# Patient Record
Sex: Male | Born: 1955 | Race: White | Hispanic: No | Marital: Single | Smoking: Former smoker
Health system: Southern US, Community
[De-identification: ages and names within clinical notes are randomized; demographics above are authoritative.]

## PROBLEM LIST (undated history)

## (undated) DIAGNOSIS — I341 Nonrheumatic mitral (valve) prolapse: Secondary | ICD-10-CM

## (undated) DIAGNOSIS — I10 Essential (primary) hypertension: Secondary | ICD-10-CM

## (undated) DIAGNOSIS — I2510 Atherosclerotic heart disease of native coronary artery without angina pectoris: Secondary | ICD-10-CM

## (undated) DIAGNOSIS — Z972 Presence of dental prosthetic device (complete) (partial): Secondary | ICD-10-CM

## (undated) DIAGNOSIS — Z974 Presence of external hearing-aid: Secondary | ICD-10-CM

## (undated) DIAGNOSIS — I7090 Unspecified atherosclerosis: Secondary | ICD-10-CM

## (undated) DIAGNOSIS — I272 Pulmonary hypertension, unspecified: Secondary | ICD-10-CM

## (undated) DIAGNOSIS — Z951 Presence of aortocoronary bypass graft: Secondary | ICD-10-CM

## (undated) DIAGNOSIS — R42 Dizziness and giddiness: Secondary | ICD-10-CM

## (undated) DIAGNOSIS — M1990 Unspecified osteoarthritis, unspecified site: Secondary | ICD-10-CM

## (undated) HISTORY — DX: Unspecified osteoarthritis, unspecified site: M19.90

## (undated) HISTORY — DX: Essential (primary) hypertension: I10

## (undated) HISTORY — PX: COLONOSCOPY: SHX174

## (undated) HISTORY — DX: Dizziness and giddiness: R42

## (undated) HISTORY — DX: Atherosclerotic heart disease of native coronary artery without angina pectoris: I25.10

## (undated) HISTORY — PX: CORONARY ARTERY BYPASS GRAFT: SHX141

## (undated) HISTORY — DX: Unspecified atherosclerosis: I70.90

## (undated) HISTORY — DX: Nonrheumatic mitral (valve) prolapse: I34.1

## (undated) HISTORY — PX: CHOLESTEATOMA EXCISION: SHX1345

---

## 2005-08-24 ENCOUNTER — Ambulatory Visit: Payer: Self-pay | Admitting: Internal Medicine

## 2006-09-03 ENCOUNTER — Other Ambulatory Visit: Payer: Self-pay

## 2006-09-09 ENCOUNTER — Ambulatory Visit: Payer: Self-pay | Admitting: Unknown Physician Specialty

## 2010-10-26 ENCOUNTER — Emergency Department: Payer: Self-pay | Admitting: Emergency Medicine

## 2010-11-01 ENCOUNTER — Ambulatory Visit: Payer: Self-pay | Admitting: Cardiovascular Disease

## 2015-06-09 ENCOUNTER — Other Ambulatory Visit: Payer: Self-pay | Admitting: Unknown Physician Specialty

## 2015-06-09 DIAGNOSIS — H7122 Cholesteatoma of mastoid, left ear: Secondary | ICD-10-CM

## 2015-06-17 ENCOUNTER — Ambulatory Visit
Admission: RE | Admit: 2015-06-17 | Discharge: 2015-06-17 | Disposition: A | Discharge status: Home / Self Care | Payer: 59 | Source: Ambulatory Visit | Attending: Unknown Physician Specialty | Admitting: Unknown Physician Specialty

## 2015-06-17 DIAGNOSIS — H7102 Cholesteatoma of attic, left ear: Secondary | ICD-10-CM | POA: Diagnosis present

## 2015-06-17 DIAGNOSIS — Z9889 Other specified postprocedural states: Secondary | ICD-10-CM | POA: Diagnosis not present

## 2015-06-17 DIAGNOSIS — H7122 Cholesteatoma of mastoid, left ear: Secondary | ICD-10-CM

## 2015-08-23 HISTORY — PX: TYMPANOPLASTY W/ MASTOIDECTOMY: SUR1400

## 2018-04-03 DIAGNOSIS — Z0000 Encounter for general adult medical examination without abnormal findings: Secondary | ICD-10-CM | POA: Diagnosis not present

## 2018-04-14 DIAGNOSIS — E669 Obesity, unspecified: Secondary | ICD-10-CM | POA: Diagnosis not present

## 2018-04-14 DIAGNOSIS — R1310 Dysphagia, unspecified: Secondary | ICD-10-CM | POA: Diagnosis not present

## 2018-04-14 DIAGNOSIS — E8881 Metabolic syndrome: Secondary | ICD-10-CM | POA: Diagnosis not present

## 2018-04-14 DIAGNOSIS — Z0000 Encounter for general adult medical examination without abnormal findings: Secondary | ICD-10-CM | POA: Diagnosis not present

## 2018-04-15 ENCOUNTER — Telehealth: Payer: Self-pay | Admitting: Gastroenterology

## 2018-04-15 NOTE — Telephone Encounter (Signed)
Patient called back to schedule colonoscopy.  ?

## 2018-04-16 DIAGNOSIS — F5221 Male erectile disorder: Secondary | ICD-10-CM | POA: Diagnosis not present

## 2018-04-16 NOTE — Telephone Encounter (Signed)
Patient having stomach pains, trouble swallowing, and constipation for the past 6 months. Will come in to be seen.

## 2018-04-17 ENCOUNTER — Encounter: Payer: Self-pay | Admitting: Gastroenterology

## 2018-04-17 ENCOUNTER — Ambulatory Visit (INDEPENDENT_AMBULATORY_CARE_PROVIDER_SITE_OTHER): Payer: Medicare Other | Admitting: Gastroenterology

## 2018-04-17 VITALS — BP 138/84 | HR 55 | Temp 97.8°F | Ht 65.0 in | Wt 241.4 lb

## 2018-04-17 DIAGNOSIS — K5900 Constipation, unspecified: Secondary | ICD-10-CM | POA: Diagnosis not present

## 2018-04-17 DIAGNOSIS — K219 Gastro-esophageal reflux disease without esophagitis: Secondary | ICD-10-CM | POA: Diagnosis not present

## 2018-04-17 NOTE — Progress Notes (Signed)
Vonda Antigua [address]  [place]  [place], [place] [address]  Main: [phone]  Fax: [phone]   Gastroenterology Consultation  Referring Provider:     Cletis Athens, MD Primary Care Physician:  Cletis Athens, MD Primary Gastroenterologist:  Dr. Vonda Antigua Reason for Consultation:     Constipation, abdominal pain, acid reflux        HPI:    Chief Complaint  Patient presents with  . Establish Care    referral from Dr. Michele Rockers pain, constipation    MAXSON ODDO is a 62 y.o. y/o male referred for consultation & management  by Dr. Cletis Athens, MD.  Patient reports chronic history of constipation.  Does have a bowel movement every 1 to 2 days.  Sometimes it soft, sometimes he has to strain.  No blood in stool.  States he is on an over-the-counter stool softener, that he does not know the name of.  Does not eat a high-fiber diet.  Reports he had a colonoscopy that he thinks might of been about 5 years ago at [facility], does not think any polyps were removed.  We do not have the report.  To the only report we have was a 2006 colonoscopy, seen under provation which reports a normal colon and internal hemorrhoids.  Patient reports family history of colon cancer in her mother, who was diagnosed in her 74s.  Also reports intermittent episodes of acid reflux.  States he recently had recurrence of acid reflux over the last 2 to 3 months.  Has not had acid reflux in over 15 years.  No dysphagia.  Reports intermittent midepigastric pain as well, sharp, no radiation, lasts 30 seconds, occurs 1-2 times a day, occurs after meals, self resolves.  No previous EGDs.  No NSAID use besides aspirin.  Past medical history: Hypertension  Past Surgical History:  Procedure Laterality Date  . COLONOSCOPY     3-4 yrs. ago  Hx polyps    Prior to Admission medications   Medication Sig Start Date End Date Taking? Authorizing Provider  aspirin EC 81 MG tablet Take by mouth.    Yes [provider]  sodium chloride (OCEAN) 0.65 % SOLN nasal spray Place 1 spray into both nostrils as needed for congestion.   Yes [provider]    Family History  Problem Relation Age of Onset  . Cancer Mother        colon  . Cancer Sister        lung cancer-hx smoking  . Heart failure Sister      Social History   Tobacco Use  . Smoking status: Former Research scientist (life sciences)  . Smokeless tobacco: Never Used  Substance Use Topics  . Alcohol use: Yes    Comment: occ.  . Drug use: Never    Allergies as of 04/17/2018  . (No Known Allergies)    Review of Systems:    All systems reviewed and negative except where noted in HPI.   Physical Exam:  BP 138/84   Pulse (!) 55   Temp 97.8 F (36.6 C) (Oral)   Ht 5\' 5"  (1.651 m)   Wt 241 lb 6.4 oz (109.5 kg)   BMI 40.17 kg/m  No LMP for male patient. Psych:  Alert and cooperative. Normal mood and affect. General:   Alert,  Well-developed, well-nourished, pleasant and cooperative in NAD Head:  Normocephalic and atraumatic. Eyes:  Sclera clear, no icterus.   Conjunctiva pink. Ears:  Normal auditory acuity. Nose:  No deformity, discharge, or lesions. Mouth:  No deformity or lesions,oropharynx pink & moist. Neck:  Supple; no masses or thyromegaly. Lungs:  Respirations even and unlabored.  Clear throughout to auscultation.   No wheezes, crackles, or rhonchi. No acute distress. Heart:  Regular rate and rhythm; no murmurs, clicks, rubs, or gallops. Abdomen:  Normal bowel sounds.  No bruits.  Soft, non-tender and non-distended without masses, hepatosplenomegaly or hernias noted.  No guarding or rebound tenderness.    Msk:  Symmetrical without gross deformities. Good, equal movement & strength bilaterally. Pulses:  Normal pulses noted. Extremities:  No clubbing or edema.  No cyanosis. Neurologic:  Alert and oriented x3;  grossly normal neurologically. Skin:  Intact without significant lesions or rashes. No jaundice. Lymph  Nodes:  No significant cervical adenopathy. Psych:  Alert and cooperative. Normal mood and affect.   Labs: CBC No results found for: WBC, RBC, HGB, HCT, PLT, MCV, MCH, MCHC, RDW, LYMPHSABS, MONOABS, EOSABS, BASOSABS CMP  No results found for: NA, K, CL, CO2, GLUCOSE, BUN, CREATININE, CALCIUM, PROT, ALBUMIN, AST, ALT, ALKPHOS, BILITOT, GFRNONAA, GFRAA  Imaging Studies: No results found.  Assessment and Plan:   ZANDON TALTON is a 62 y.o. y/o male has been referred for history of constipation, and acid reflux  Constipation due to not eating enough fiber High-fiber diet encouraged Patient asked to start using MiraLAX or Metamucil daily to maintain soft stool We will schedule for high risk screening colonoscopy, as according to patient last one was over 5 years ago (report of which not available)  Recurrence of acid reflux Patient educated extensively on acid reflux lifestyle modification, including buying a bed wedge, not eating 3 hrs before bedtime, diet modifications, and handout given for the same.  Patient with over 44 years old, has recurrence of symptoms, will evaluate with EGD to rule out any underlying Barrett's, obtain H. pylori biopsies and rule out any underlying ulcers  I have discussed alternative options, risks & benefits,  which include, but are not limited to, bleeding, infection, perforation,respiratory complication & drug reaction.  The patient agrees with this plan & written consent will be obtained.     Dr Vonda Antigua

## 2018-04-18 ENCOUNTER — Telehealth: Payer: Self-pay | Admitting: Gastroenterology

## 2018-04-18 NOTE — Telephone Encounter (Signed)
Patient LVM that he wants to give you a list of his medications.

## 2018-04-22 ENCOUNTER — Telehealth: Payer: Self-pay | Admitting: Gastroenterology

## 2018-04-22 DIAGNOSIS — K219 Gastro-esophageal reflux disease without esophagitis: Secondary | ICD-10-CM

## 2018-04-22 DIAGNOSIS — R109 Unspecified abdominal pain: Secondary | ICD-10-CM

## 2018-04-22 DIAGNOSIS — Z1211 Encounter for screening for malignant neoplasm of colon: Secondary | ICD-10-CM

## 2018-04-22 DIAGNOSIS — Z800 Family history of malignant neoplasm of digestive organs: Secondary | ICD-10-CM

## 2018-04-22 NOTE — Telephone Encounter (Signed)
Medication List: Atorvastatin 400 mg once a day Metoprol Tar 25 mg q 12 h Loratadine 10 mg 1 tab a day Asprin 81 mg

## 2018-04-22 NOTE — Telephone Encounter (Signed)
Refer to other message dated 04/22/18

## 2018-04-22 NOTE — Telephone Encounter (Signed)
Left message that medication list received and can now schedule colonoscopy and EGD. Pt is not on Plavix as previously seen in chart and had to call with the other medication he was on.

## 2018-04-22 NOTE — Telephone Encounter (Signed)
Pt called and will contact office in about 15 minutes, he is at work.

## 2018-04-23 ENCOUNTER — Telehealth: Payer: Self-pay | Admitting: Gastroenterology

## 2018-04-23 ENCOUNTER — Other Ambulatory Visit: Payer: Self-pay

## 2018-04-23 ENCOUNTER — Encounter: Payer: Self-pay | Admitting: Gastroenterology

## 2018-04-23 DIAGNOSIS — Z1211 Encounter for screening for malignant neoplasm of colon: Secondary | ICD-10-CM

## 2018-04-23 DIAGNOSIS — R109 Unspecified abdominal pain: Secondary | ICD-10-CM

## 2018-04-23 DIAGNOSIS — Z800 Family history of malignant neoplasm of digestive organs: Secondary | ICD-10-CM

## 2018-04-23 DIAGNOSIS — K219 Gastro-esophageal reflux disease without esophagitis: Secondary | ICD-10-CM

## 2018-04-23 MED ORDER — BISACODYL 5 MG PO TBEC: 10.0000 mg | DELAYED_RELEASE_TABLET | Freq: Once | ORAL | 0 refills | Status: AC

## 2018-04-23 MED ORDER — BISACODYL 5 MG PO TBEC: 10.0000 mg | DELAYED_RELEASE_TABLET | Freq: Once | ORAL | 0 refills | Status: DC

## 2018-04-23 MED ORDER — PEG 3350-KCL-NABCB-NACL-NASULF 236 G PO SOLR: 4000.0000 mL | Freq: Once | ORAL | 0 refills | Status: AC

## 2018-04-23 MED ORDER — PEG 3350-KCL-NA BICARB-NACL 420 G PO SOLR: 4000.0000 mL | Freq: Once | ORAL | 0 refills | Status: AC

## 2018-04-23 NOTE — Telephone Encounter (Signed)
Wife notified that rx's sent to [facility] in [place].

## 2018-04-23 NOTE — Telephone Encounter (Signed)
Patient came in and wants his prep called into [facility] in [place]

## 2018-04-23 NOTE — Telephone Encounter (Signed)
I spoke with pt's wife and scheduled pt's colonoscopy and EGD for 05/01/18 at [facility]. Went over prep instructions with his wife and she will pick those up this afternoon. To contact office if they have any further questions.

## 2018-04-30 ENCOUNTER — Encounter: Payer: Self-pay | Admitting: Student

## 2018-05-01 ENCOUNTER — Ambulatory Visit: Payer: Medicare Other | Admitting: Anesthesiology

## 2018-05-01 ENCOUNTER — Encounter
Admission: RE | Disposition: A | Discharge status: Home / Self Care | Payer: Self-pay | Source: Ambulatory Visit | Attending: Gastroenterology

## 2018-05-01 ENCOUNTER — Ambulatory Visit
Admission: RE | Admit: 2018-05-01 | Discharge: 2018-05-01 | Disposition: A | Discharge status: Home / Self Care | Payer: Medicare Other | Source: Ambulatory Visit | Attending: Gastroenterology | Admitting: Gastroenterology

## 2018-05-01 ENCOUNTER — Other Ambulatory Visit: Payer: Self-pay

## 2018-05-01 ENCOUNTER — Encounter: Payer: Self-pay | Admitting: *Deleted

## 2018-05-01 DIAGNOSIS — I341 Nonrheumatic mitral (valve) prolapse: Secondary | ICD-10-CM | POA: Insufficient documentation

## 2018-05-01 DIAGNOSIS — K2960 Other gastritis without bleeding: Secondary | ICD-10-CM | POA: Diagnosis not present

## 2018-05-01 DIAGNOSIS — R1084 Generalized abdominal pain: Secondary | ICD-10-CM | POA: Diagnosis not present

## 2018-05-01 DIAGNOSIS — Z79899 Other long term (current) drug therapy: Secondary | ICD-10-CM | POA: Insufficient documentation

## 2018-05-01 DIAGNOSIS — M1990 Unspecified osteoarthritis, unspecified site: Secondary | ICD-10-CM | POA: Insufficient documentation

## 2018-05-01 DIAGNOSIS — D122 Benign neoplasm of ascending colon: Secondary | ICD-10-CM | POA: Diagnosis not present

## 2018-05-01 DIAGNOSIS — Z951 Presence of aortocoronary bypass graft: Secondary | ICD-10-CM | POA: Insufficient documentation

## 2018-05-01 DIAGNOSIS — R109 Unspecified abdominal pain: Secondary | ICD-10-CM

## 2018-05-01 DIAGNOSIS — Z7982 Long term (current) use of aspirin: Secondary | ICD-10-CM | POA: Insufficient documentation

## 2018-05-01 DIAGNOSIS — I10 Essential (primary) hypertension: Secondary | ICD-10-CM | POA: Insufficient documentation

## 2018-05-01 DIAGNOSIS — K219 Gastro-esophageal reflux disease without esophagitis: Secondary | ICD-10-CM | POA: Diagnosis not present

## 2018-05-01 DIAGNOSIS — Z8249 Family history of ischemic heart disease and other diseases of the circulatory system: Secondary | ICD-10-CM | POA: Insufficient documentation

## 2018-05-01 DIAGNOSIS — Z6839 Body mass index (BMI) 39.0-39.9, adult: Secondary | ICD-10-CM | POA: Insufficient documentation

## 2018-05-01 DIAGNOSIS — Z800 Family history of malignant neoplasm of digestive organs: Secondary | ICD-10-CM | POA: Insufficient documentation

## 2018-05-01 DIAGNOSIS — Z1211 Encounter for screening for malignant neoplasm of colon: Secondary | ICD-10-CM | POA: Insufficient documentation

## 2018-05-01 DIAGNOSIS — K3189 Other diseases of stomach and duodenum: Secondary | ICD-10-CM

## 2018-05-01 DIAGNOSIS — I2510 Atherosclerotic heart disease of native coronary artery without angina pectoris: Secondary | ICD-10-CM | POA: Insufficient documentation

## 2018-05-01 DIAGNOSIS — Z801 Family history of malignant neoplasm of trachea, bronchus and lung: Secondary | ICD-10-CM | POA: Diagnosis not present

## 2018-05-01 DIAGNOSIS — R12 Heartburn: Secondary | ICD-10-CM | POA: Diagnosis not present

## 2018-05-01 DIAGNOSIS — K2100 Gastro-esophageal reflux disease with esophagitis, without bleeding: Secondary | ICD-10-CM

## 2018-05-01 DIAGNOSIS — K635 Polyp of colon: Secondary | ICD-10-CM | POA: Diagnosis not present

## 2018-05-01 DIAGNOSIS — D120 Benign neoplasm of cecum: Secondary | ICD-10-CM | POA: Insufficient documentation

## 2018-05-01 DIAGNOSIS — D126 Benign neoplasm of colon, unspecified: Secondary | ICD-10-CM | POA: Diagnosis not present

## 2018-05-01 HISTORY — PX: ESOPHAGOGASTRODUODENOSCOPY (EGD) WITH PROPOFOL: SHX5813

## 2018-05-01 HISTORY — PX: COLONOSCOPY WITH PROPOFOL: SHX5780

## 2018-05-01 SURGERY — COLONOSCOPY WITH PROPOFOL
Anesthesia: General

## 2018-05-01 MED ORDER — PROPOFOL 500 MG/50ML IV EMUL
INTRAVENOUS | Status: AC
  Filled 2018-05-01: qty 50

## 2018-05-01 MED ORDER — LIDOCAINE HCL (CARDIAC) PF 100 MG/5ML IV SOSY
PREFILLED_SYRINGE | INTRAVENOUS | Status: DC | PRN
  Administered 2018-05-01: 100 mg via INTRAVENOUS

## 2018-05-01 MED ORDER — PROPOFOL 10 MG/ML IV BOLUS
INTRAVENOUS | Status: AC
  Filled 2018-05-01: qty 20

## 2018-05-01 MED ORDER — PROPOFOL 10 MG/ML IV BOLUS
INTRAVENOUS | Status: DC | PRN
  Administered 2018-05-01: 70 mg via INTRAVENOUS

## 2018-05-01 MED ORDER — RANITIDINE HCL 75 MG PO TABS: 75.0000 mg | ORAL_TABLET | Freq: Two times a day (BID) | ORAL | 1 refills | Status: DC

## 2018-05-01 MED ORDER — LIDOCAINE HCL (PF) 2 % IJ SOLN
INTRAMUSCULAR | Status: AC
  Filled 2018-05-01: qty 10

## 2018-05-01 MED ORDER — PHENYLEPHRINE HCL 10 MG/ML IJ SOLN
INTRAMUSCULAR | Status: DC | PRN
  Administered 2018-05-01 (×3): 50 ug via INTRAVENOUS

## 2018-05-01 MED ORDER — PROPOFOL 500 MG/50ML IV EMUL
INTRAVENOUS | Status: DC | PRN
  Administered 2018-05-01: 160 ug/kg/min via INTRAVENOUS

## 2018-05-01 MED ORDER — SODIUM CHLORIDE 0.9 % IV SOLN
INTRAVENOUS | Status: DC
  Administered 2018-05-01: 08:00:00 via INTRAVENOUS

## 2018-05-01 NOTE — Transfer of Care (Signed)
Immediate Anesthesia Transfer of Care Note  Patient: Thomas Hood  Procedure(s) Performed: COLONOSCOPY WITH PROPOFOL (N/A ) ESOPHAGOGASTRODUODENOSCOPY (EGD) WITH PROPOFOL (N/A )  Patient Location: PACU  Anesthesia Type:General  Level of Consciousness: awake, alert  and oriented  Airway & Oxygen Therapy: Patient Spontanous Breathing and Patient connected to nasal cannula oxygen  Post-op Assessment: Report given to RN and Post -op Vital signs reviewed and stable  Post vital signs: Reviewed and stable  Last Vitals:  Vitals Value Taken Time  BP 99/61 0916  Temp 97   Pulse 50   Resp 13   SpO2 100     Last Pain:  Vitals:   05/01/18 0719  TempSrc: Tympanic  PainSc: 0-No pain         Complications: No apparent anesthesia complications

## 2018-05-01 NOTE — Anesthesia Postprocedure Evaluation (Signed)
Anesthesia Post Note  Patient: Thomas Hood  Procedure(s) Performed: COLONOSCOPY WITH PROPOFOL (N/A ) ESOPHAGOGASTRODUODENOSCOPY (EGD) WITH PROPOFOL (N/A )  Patient location during evaluation: Endoscopy Anesthesia Type: General Level of consciousness: awake and alert Pain management: pain level controlled Vital Signs Assessment: post-procedure vital signs reviewed and stable Respiratory status: spontaneous breathing, nonlabored ventilation and respiratory function stable Cardiovascular status: blood pressure returned to baseline and stable Postop Assessment: no apparent nausea or vomiting Anesthetic complications: no     Last Vitals:  Vitals:   05/01/18 0937 05/01/18 0947  BP: (!) 89/68 114/82  Pulse:    Resp:    Temp:    SpO2: 98%     Last Pain:  Vitals:   05/01/18 0947  TempSrc:   PainSc: 0-No pain                 Alphonsus Sias

## 2018-05-01 NOTE — H&P (Signed)
Vonda Antigua, MD [address], [place], [place], [place], [address] [address], [place], [place], [place], [address] Phone: [phone]  Fax: [phone]  Primary Care Physician:  Cletis Athens, MD   Pre-Procedure History & Physical: HPI:  ABEM SHADDIX is a 62 y.o. male is here for a colonoscopy and EGD.   Past Medical History:  Diagnosis Date  . Arthritis   . Atherosclerosis   . CAD (coronary artery disease)   . Dizziness   . HTN (hypertension)   . Mitral valve prolapse     Past Surgical History:  Procedure Laterality Date  . CHOLESTEATOMA EXCISION Right   . COLONOSCOPY     3-4 yrs. ago  Hx polyps  . CORONARY ARTERY BYPASS GRAFT    . TYMPANOPLASTY W/ MASTOIDECTOMY  08/23/2015    Prior to Admission medications   Medication Sig Start Date End Date Taking? Authorizing Provider  atorvastatin (LIPITOR) 40 MG tablet Take 40 mg by mouth daily.   Yes [provider]  loratadine (CLARITIN) 10 MG tablet  03/03/18  Yes [provider]  metoprolol tartrate (LOPRESSOR) 25 MG tablet Take 1 tablet by mouth every 12 (twelve) hours. 04/16/18  Yes [provider]  Multiple Vitamin (MULTIVITAMIN) tablet Take 1 tablet by mouth daily.   Yes [provider]  aspirin EC 81 MG tablet Take by mouth.    [provider]  sodium chloride (OCEAN) 0.65 % SOLN nasal spray Place 1 spray into both nostrils as needed for congestion.    [provider]    Allergies as of 04/23/2018  . (No Known Allergies)    Family History  Problem Relation Age of Onset  . Cancer Mother        colon  . Cancer Sister        lung cancer-hx smoking  . Heart failure Sister     Social History   Socioeconomic History  . Marital status: Single    Spouse name: Not on file  . Number of children: Not on file  . Years of education: Not on file  . Highest education level: Not on file  Occupational History  . Not on file  Social Needs  . Financial  resource strain: Not on file  . Food insecurity:    Worry: Not on file    Inability: Not on file  . Transportation needs:    Medical: Not on file    Non-medical: Not on file  Tobacco Use  . Smoking status: Former Research scientist (life sciences)  . Smokeless tobacco: Never Used  Substance and Sexual Activity  . Alcohol use: Yes    Comment: occ.  . Drug use: Never  . Sexual activity: Not on file  Lifestyle  . Physical activity:    Days per week: Not on file    Minutes per session: Not on file  . Stress: Not on file  Relationships  . Social connections:    Talks on phone: Not on file    Gets together: Not on file    Attends religious service: Not on file    Active member of club or organization: Not on file    Attends meetings of clubs or organizations: Not on file    Relationship status: Not on file  . Intimate partner violence:    Fear of current or ex partner: Not on file    Emotionally abused: Not on file    Physically abused: Not on file    Forced sexual activity: Not on  file  Other Topics Concern  . Not on file  Social History Narrative  . Not on file    Review of Systems: See HPI, otherwise negative ROS  Physical Exam: BP 120/71   Pulse (!) 56   Temp (!) 97 F (36.1 C) (Tympanic)   Resp 18   Ht 5\' 5"  (1.651 m)   Wt 240 lb (108.9 kg)   SpO2 100%   BMI 39.94 kg/m  General:   Alert,  pleasant and cooperative in NAD Head:  Normocephalic and atraumatic. Neck:  Supple; no masses or thyromegaly. Lungs:  Clear throughout to auscultation, normal respiratory effort.    Heart:  +S1, +S2, Regular rate and rhythm, No edema. Abdomen:  Soft, nontender and nondistended. Normal bowel sounds, without guarding, and without rebound.   Neurologic:  Alert and  oriented x4;  grossly normal neurologically.  Impression/Plan: CAGNEY DEGRACE is here for a colonoscopy to be performed for average risk screening and EGD for Acid Reflux.  Risks, benefits, limitations, and alternatives regarding the  procedures have been reviewed with the patient.  Questions have been answered.  All parties agreeable.   Virgel Manifold, MD  05/01/2018, 8:02 AM

## 2018-05-01 NOTE — Anesthesia Post-op Follow-up Note (Signed)
Anesthesia QCDR form completed.        

## 2018-05-01 NOTE — Op Note (Signed)
[facility] Gastroenterology Patient Name: Thomas Hood Procedure Date: 05/01/2018 7:20 AM MRN: [id number] Account #: [id number] Date of Birth: 1956/05/22 Admit Type: Outpatient Age: 62 Room: [facility] ENDO ROOM 4 Gender: Male Note Status: Finalized Procedure:            Colonoscopy Indications:          Screening in patient at increased risk: Family history                        of 1st-degree relative with colorectal cancer before                        age 72 years Providers:            Merri Dimaano B. Bonna Gains MD, MD Referring MD:         Cletis Athens, MD (Referring MD) Medicines:            Monitored Anesthesia Care Complications:        No immediate complications. Procedure:            Pre-Anesthesia Assessment:                       - ASA Grade Assessment: III - A patient with severe                        systemic disease.                       - Prior to the procedure, a History and Physical was                        performed, and patient medications, allergies and                        sensitivities were reviewed. The patient's tolerance of                        previous anesthesia was reviewed.                       - The risks and benefits of the procedure and the                        sedation options and risks were discussed with the                        patient. All questions were answered and informed                        consent was obtained.                       - Patient identification and proposed procedure were                        verified prior to the procedure by the physician, the                        nurse, the anesthesiologist, the anesthetist and the  technician. The procedure was verified in the procedure                        room.                       After obtaining informed consent, the colonoscope was                        passed under direct vision. Throughout the procedure,   the patient's blood pressure, pulse, and oxygen                        saturations were monitored continuously. The                        Colonoscope was introduced through the anus and                        advanced to the the cecum, identified by appendiceal                        orifice and ileocecal valve. The colonoscopy was                        performed with ease. The patient tolerated the                        procedure well. The quality of the bowel preparation                        was good. Findings:      The perianal and digital rectal examinations were normal.      Six sessile polyps were found in the ascending colon and cecum. The       polyps were 4 to 7 mm in size. These polyps were removed with a cold       snare. Resection and retrieval were complete.      The exam was otherwise without abnormality.      The rectum, sigmoid colon, descending colon, transverse colon, ascending       colon and cecum appeared normal.      The retroflexed view of the distal rectum and anal verge was normal and       showed no anal or rectal abnormalities. Impression:           - Six 4 to 7 mm polyps in the ascending colon and in                        the cecum, removed with a cold snare. Resected and                        retrieved.                       - The examination was otherwise normal.                       - The rectum, sigmoid colon, descending colon,                        transverse colon, ascending colon and  cecum are normal.                       - The distal rectum and anal verge are normal on                        retroflexion view. Recommendation:       - Discharge patient to home (with escort).                       - Advance diet as tolerated.                       - Continue present medications.                       - Await pathology results.                       - Repeat colonoscopy in 3 years for surveillance.                       - The findings and  recommendations were discussed with                        the patient.                       - The findings and recommendations were discussed with                        the patient's family.                       - Return to primary care physician as previously                        scheduled. Procedure Code(s):    --- Professional ---                       [phone], Colonoscopy, flexible; with removal of tumor(s),                        polyp(s), or other lesion(s) by snare technique Diagnosis Code(s):    --- Professional ---                       Z80.0, Family history of malignant neoplasm of                        digestive organs                       D12.2, Benign neoplasm of ascending colon                       D12.0, Benign neoplasm of cecum CPT copyright 2017 American Medical Association. All rights reserved. The codes documented in this report are preliminary and upon coder review may  be revised to meet current compliance requirements.  Vonda Antigua, MD Margretta Sidle B. Bonna Gains MD, MD 05/01/2018 9:15:59 AM This report has been signed electronically. Number of Addenda: 0 Note Initiated On: 05/01/2018 7:20 AM Scope Withdrawal Time: 0 hours 31 minutes 58 seconds  Total Procedure Duration: 0 hours 36 minutes 56 seconds  Estimated Blood Loss: Estimated blood loss: none.      [facility]

## 2018-05-01 NOTE — Op Note (Addendum)
[facility] Gastroenterology Patient Name: Thomas Hood Procedure Date: 05/01/2018 7:23 AM MRN: [id number] Account #: [id number] Date of Birth: 08/30/1956 Admit Type: Outpatient Age: 62 Room: [facility] ENDO ROOM 4 Gender: Male Note Status: Finalized Procedure:            Upper GI endoscopy Indications:          Heartburn Providers:            Nikeya Maxim B. Bonna Gains MD, MD Referring MD:         Cletis Athens, MD (Referring MD) Medicines:            Monitored Anesthesia Care Complications:        No immediate complications. Procedure:            Pre-Anesthesia Assessment:                       - Prior to the procedure, a History and Physical was                        performed, and patient medications, allergies and                        sensitivities were reviewed. The patient's tolerance of                        previous anesthesia was reviewed.                       - The risks and benefits of the procedure and the                        sedation options and risks were discussed with the                        patient. All questions were answered and informed                        consent was obtained.                       - Patient identification and proposed procedure were                        verified prior to the procedure by the physician, the                        nurse, the anesthesiologist, the anesthetist and the                        technician. The procedure was verified in the procedure                        room.                       - ASA Grade Assessment: III - A patient with severe                        systemic disease.                       After obtaining  informed consent, the endoscope was                        passed under direct vision. Throughout the procedure,                        the patient's blood pressure, pulse, and oxygen                        saturations were monitored continuously. The Endoscope                        was  introduced through the mouth, and advanced to the                        third part of duodenum. The upper GI endoscopy was                        accomplished with ease. The patient tolerated the                        procedure well. Findings:      LA Grade A (one or more mucosal breaks less than 5 mm, not extending       between tops of 2 mucosal folds) esophagitis with no bleeding was found       in the distal esophagus.      Patchy mildly erythematous mucosa without bleeding was found in the       gastric antrum. Biopsies were taken with a cold forceps for histology.       Biopsies were obtained in the gastric body, at the incisura and in the       gastric antrum with cold forceps for histology.      The duodenal bulb, second portion of the duodenum and examined duodenum       were normal. Impression:           - LA Grade A reflux esophagitis.                       - Erythematous mucosa in the antrum. Biopsied.                       - Normal duodenal bulb, second portion of the duodenum                        and examined duodenum.                       - Biopsies were obtained in the gastric body, at the                        incisura and in the gastric antrum. Recommendation:       - Await pathology results.                       - Follow an antireflux regimen.                       - Take prescribed proton pump inhibitor or H2 blocker                        (  antacid) medications 30 - 60 minutes before meals.                       - Discharge patient to home (with escort).                       - Advance diet as tolerated.                       - Continue present medications.                       - Patient has a contact number available for                        emergencies. The signs and symptoms of potential                        delayed complications were discussed with the patient.                        Return to normal activities tomorrow. Written discharge                         instructions were provided to the patient.                       - Discharge patient to home (with escort).                       - The findings and recommendations were discussed with                        the patient.                       - The findings and recommendations were discussed with                        the patient's family.                       - Return to my office as previously scheduled. Procedure Code(s):    --- Professional ---                       [phone], Esophagogastroduodenoscopy, flexible, transoral;                        with biopsy, single or multiple Diagnosis Code(s):    --- Professional ---                       K21.0, Gastro-esophageal reflux disease with esophagitis                       K31.89, Other diseases of stomach and duodenum                       R12, Heartburn CPT copyright 2017 American Medical Association. All rights reserved. The codes documented in this report are preliminary and upon coder review may  be revised to meet current compliance requirements.  Vonda Antigua, MD Margretta Sidle B. Bonna Gains MD, MD  05/01/2018 8:16:39 AM This report has been signed electronically. Number of Addenda: 0 Note Initiated On: 05/01/2018 7:23 AM Estimated Blood Loss: Estimated blood loss: none.      [facility]

## 2018-05-01 NOTE — Anesthesia Procedure Notes (Signed)
Performed by: Oddis Westling, CRNA Pre-anesthesia Checklist: Patient identified, Emergency Drugs available, Suction available and Patient being monitored Patient Re-evaluated:Patient Re-evaluated prior to induction Oxygen Delivery Method: Nasal cannula Induction Type: IV induction       

## 2018-05-01 NOTE — Anesthesia Preprocedure Evaluation (Signed)
Anesthesia Evaluation  Patient identified by MRN, date of birth, ID band Patient awake    Reviewed: Allergy & Precautions, H&P , NPO status , reviewed documented beta blocker date and time   Airway Mallampati: II  TM Distance: >3 FB Neck ROM: full    Dental  (+) Upper Dentures, Lower Dentures   Pulmonary former smoker,    Pulmonary exam normal        Cardiovascular hypertension, + CAD  Normal cardiovascular exam     Neuro/Psych    GI/Hepatic   Endo/Other  Morbid obesity  Renal/GU      Musculoskeletal  (+) Arthritis ,   Abdominal   Peds  Hematology   Anesthesia Other Findings Past Medical History: No date: Arthritis No date: Atherosclerosis No date: CAD (coronary artery disease) No date: Dizziness No date: HTN (hypertension) No date: Mitral valve prolapse  Past Surgical History: No date: CHOLESTEATOMA EXCISION; Right No date: COLONOSCOPY     Comment:  3-4 yrs. ago  Hx polyps No date: CORONARY ARTERY BYPASS GRAFT 08/23/2015: TYMPANOPLASTY W/ MASTOIDECTOMY  BMI    Body Mass Index:  39.94 kg/m      Reproductive/Obstetrics                             Anesthesia Physical Anesthesia Plan  ASA: III  Anesthesia Plan: General   Post-op Pain Management:    Induction:   PONV Risk Score and Plan: 2 and Treatment may vary due to age or medical condition and TIVA  Airway Management Planned:   Additional Equipment:   Intra-op Plan:   Post-operative Plan:   Informed Consent: I have reviewed the patients History and Physical, chart, labs and discussed the procedure including the risks, benefits and alternatives for the proposed anesthesia with the patient or authorized representative who has indicated his/her understanding and acceptance.   Dental Advisory Given  Plan Discussed with: CRNA  Anesthesia Plan Comments:         Anesthesia Quick Evaluation

## 2018-05-02 ENCOUNTER — Encounter: Payer: Self-pay | Admitting: Gastroenterology

## 2018-05-02 LAB — SURGICAL PATHOLOGY

## 2019-11-03 DIAGNOSIS — Z6841 Body Mass Index (BMI) 40.0 and over, adult: Secondary | ICD-10-CM | POA: Diagnosis not present

## 2019-11-03 DIAGNOSIS — Z20828 Contact with and (suspected) exposure to other viral communicable diseases: Secondary | ICD-10-CM | POA: Diagnosis not present

## 2019-11-18 ENCOUNTER — Encounter (HOSPITAL_COMMUNITY): Payer: Self-pay | Admitting: Emergency Medicine

## 2019-11-18 ENCOUNTER — Inpatient Hospital Stay (HOSPITAL_COMMUNITY)
Admission: EM | Admit: 2019-11-18 | Discharge: 2019-11-24 | DRG: 177 | Disposition: A | Discharge status: Home / Self Care | Payer: Medicare Other | Attending: Internal Medicine | Admitting: Internal Medicine

## 2019-11-18 ENCOUNTER — Emergency Department (HOSPITAL_COMMUNITY): Payer: Medicare Other

## 2019-11-18 ENCOUNTER — Other Ambulatory Visit: Payer: Self-pay

## 2019-11-18 DIAGNOSIS — M1990 Unspecified osteoarthritis, unspecified site: Secondary | ICD-10-CM | POA: Diagnosis present

## 2019-11-18 DIAGNOSIS — R0602 Shortness of breath: Secondary | ICD-10-CM

## 2019-11-18 DIAGNOSIS — J9601 Acute respiratory failure with hypoxia: Secondary | ICD-10-CM | POA: Diagnosis not present

## 2019-11-18 DIAGNOSIS — E669 Obesity, unspecified: Secondary | ICD-10-CM | POA: Diagnosis present

## 2019-11-18 DIAGNOSIS — J1289 Other viral pneumonia: Secondary | ICD-10-CM | POA: Diagnosis not present

## 2019-11-18 DIAGNOSIS — Z8249 Family history of ischemic heart disease and other diseases of the circulatory system: Secondary | ICD-10-CM

## 2019-11-18 DIAGNOSIS — I10 Essential (primary) hypertension: Secondary | ICD-10-CM | POA: Diagnosis not present

## 2019-11-18 DIAGNOSIS — U071 COVID-19: Secondary | ICD-10-CM | POA: Diagnosis not present

## 2019-11-18 DIAGNOSIS — J069 Acute upper respiratory infection, unspecified: Secondary | ICD-10-CM | POA: Diagnosis present

## 2019-11-18 DIAGNOSIS — Z87891 Personal history of nicotine dependence: Secondary | ICD-10-CM

## 2019-11-18 DIAGNOSIS — Z951 Presence of aortocoronary bypass graft: Secondary | ICD-10-CM

## 2019-11-18 DIAGNOSIS — I2510 Atherosclerotic heart disease of native coronary artery without angina pectoris: Secondary | ICD-10-CM | POA: Diagnosis not present

## 2019-11-18 DIAGNOSIS — R0902 Hypoxemia: Secondary | ICD-10-CM

## 2019-11-18 DIAGNOSIS — E785 Hyperlipidemia, unspecified: Secondary | ICD-10-CM | POA: Diagnosis present

## 2019-11-18 DIAGNOSIS — Z6839 Body mass index (BMI) 39.0-39.9, adult: Secondary | ICD-10-CM

## 2019-11-18 DIAGNOSIS — I341 Nonrheumatic mitral (valve) prolapse: Secondary | ICD-10-CM | POA: Diagnosis present

## 2019-11-18 DIAGNOSIS — J129 Viral pneumonia, unspecified: Secondary | ICD-10-CM | POA: Diagnosis present

## 2019-11-18 DIAGNOSIS — I700 Atherosclerosis of aorta: Secondary | ICD-10-CM | POA: Diagnosis present

## 2019-11-18 DIAGNOSIS — Z7982 Long term (current) use of aspirin: Secondary | ICD-10-CM

## 2019-11-18 LAB — CBC WITH DIFFERENTIAL/PLATELET
Abs Immature Granulocytes: 0.02 10*3/uL (ref 0.00–0.07)
Basophils Absolute: 0 10*3/uL (ref 0.0–0.1)
Basophils Relative: 0 %
Eosinophils Absolute: 0 10*3/uL (ref 0.0–0.5)
Eosinophils Relative: 0 %
HCT: 45.2 % (ref 39.0–52.0)
Hemoglobin: 14.7 g/dL (ref 13.0–17.0)
Immature Granulocytes: 0 %
Lymphocytes Relative: 24 %
Lymphs Abs: 1.4 10*3/uL (ref 0.7–4.0)
MCH: 29.9 pg (ref 26.0–34.0)
MCHC: 32.5 g/dL (ref 30.0–36.0)
MCV: 91.9 fL (ref 80.0–100.0)
Monocytes Absolute: 0.3 10*3/uL (ref 0.1–1.0)
Monocytes Relative: 5 %
Neutro Abs: 4.1 10*3/uL (ref 1.7–7.7)
Neutrophils Relative %: 71 %
Platelets: 198 10*3/uL (ref 150–400)
RBC: 4.92 MIL/uL (ref 4.22–5.81)
RDW: 12.9 % (ref 11.5–15.5)
WBC: 5.8 10*3/uL (ref 4.0–10.5)
nRBC: 0 % (ref 0.0–0.2)

## 2019-11-18 LAB — COMPREHENSIVE METABOLIC PANEL
ALT: 42 U/L (ref 0–44)
AST: 58 U/L — ABNORMAL HIGH (ref 15–41)
Albumin: 3.5 g/dL (ref 3.5–5.0)
Alkaline Phosphatase: 76 U/L (ref 38–126)
Anion gap: 16 — ABNORMAL HIGH (ref 5–15)
BUN: 15 mg/dL (ref 8–23)
CO2: 22 mmol/L (ref 22–32)
Calcium: 8.5 mg/dL — ABNORMAL LOW (ref 8.9–10.3)
Chloride: 100 mmol/L (ref 98–111)
Creatinine, Ser: 1.18 mg/dL (ref 0.61–1.24)
GFR calc Af Amer: >60 mL/min (ref >60)
GFR calc non Af Amer: >60 mL/min (ref >60)
Glucose, Bld: 100 mg/dL — ABNORMAL HIGH (ref 70–99)
Potassium: 3.5 mmol/L (ref 3.5–5.1)
Sodium: 138 mmol/L (ref 135–145)
Total Bilirubin: 1.1 mg/dL (ref 0.3–1.2)
Total Protein: 8.2 g/dL — ABNORMAL HIGH (ref 6.5–8.1)

## 2019-11-18 LAB — FERRITIN: Ferritin: 1082 ng/mL — ABNORMAL HIGH (ref 24–336)

## 2019-11-18 LAB — RESPIRATORY PANEL BY RT PCR (FLU A&B, COVID)
Influenza A by PCR: NEGATIVE
Influenza B by PCR: NEGATIVE
SARS Coronavirus 2 by RT PCR: NEGATIVE

## 2019-11-18 LAB — LACTATE DEHYDROGENASE: LDH: 341 U/L — ABNORMAL HIGH (ref 98–192)

## 2019-11-18 LAB — D-DIMER, QUANTITATIVE: D-Dimer, Quant: 5.47 ug/mL-FEU — ABNORMAL HIGH (ref 0.00–0.50)

## 2019-11-18 LAB — C-REACTIVE PROTEIN: CRP: 18.2 mg/dL — ABNORMAL HIGH (ref <1.0)

## 2019-11-18 LAB — TRIGLYCERIDES: Triglycerides: 69 mg/dL (ref <150)

## 2019-11-18 LAB — FIBRINOGEN: Fibrinogen: 750 mg/dL — ABNORMAL HIGH (ref 210–475)

## 2019-11-18 LAB — POC SARS CORONAVIRUS 2 AG -  ED: SARS Coronavirus 2 Ag: NEGATIVE

## 2019-11-18 LAB — PROCALCITONIN: Procalcitonin: <0.1 ng/mL

## 2019-11-18 LAB — LACTIC ACID, PLASMA: Lactic Acid, Venous: 1.8 mmol/L (ref 0.5–1.9)

## 2019-11-18 NOTE — ED Provider Notes (Addendum)
[facility] EMERGENCY DEPARTMENT Provider Note   CSN: [id number] Arrival date & time: 11/18/19  2030     History   Chief Complaint Chief Complaint  Patient presents with  . Shortness of Breath    HPI Thomas Hood is a 63 y.o. male.     Patient brought in by EMS from [facility].  When EMS arrived patient's room air sats were 87%.  Patient told him that he been positive for COVID-19 for 10 days symptomatic since November 29 first positive test was November 30.  He had a testing between [address] [place] where he was negative and then he was tested here again recently and was positive.  Our point-of-care test here today is negative.  Patient states that he started feeling short of breath and gradually getting worse about 4 days ago with his cough.  Shortness of breath got significantly worse yesterday.  Still having some low-grade fevers.  No diarrhea.  Past medical history significant for coronary artery disease hypertension.  No documented positive test in our system.     Past Medical History:  Diagnosis Date  . Arthritis   . Atherosclerosis   . CAD (coronary artery disease)   . Dizziness   . HTN (hypertension)   . Mitral valve prolapse     Patient Active Problem List   Diagnosis Date Noted  . Reflux esophagitis   . Stomach irritation   . Heartburn   . Family history of colon cancer in mother   . Benign neoplasm of ascending colon   . Benign neoplasm of cecum     Past Surgical History:  Procedure Laterality Date  . CHOLESTEATOMA EXCISION Right   . COLONOSCOPY     3-4 yrs. ago  Hx polyps  . COLONOSCOPY WITH PROPOFOL N/A 05/01/2018   Procedure: COLONOSCOPY WITH PROPOFOL;  Surgeon: Virgel Manifold, MD;  Location: [facility] ENDOSCOPY;  Service: Endoscopy;  Laterality: N/A;  . CORONARY ARTERY BYPASS GRAFT    . ESOPHAGOGASTRODUODENOSCOPY (EGD) WITH PROPOFOL N/A 05/01/2018   Procedure: ESOPHAGOGASTRODUODENOSCOPY (EGD) WITH PROPOFOL;  Surgeon: Virgel Manifold, MD;   Location: [facility] ENDOSCOPY;  Service: Endoscopy;  Laterality: N/A;  . TYMPANOPLASTY W/ MASTOIDECTOMY  08/23/2015        Home Medications    Prior to Admission medications   Medication Sig Start Date End Date Taking? Authorizing Provider  aspirin EC 81 MG tablet Take by mouth.    [provider]  atorvastatin (LIPITOR) 40 MG tablet Take 40 mg by mouth daily.    [provider]  loratadine (CLARITIN) 10 MG tablet  03/03/18   [provider]  metoprolol tartrate (LOPRESSOR) 25 MG tablet Take 1 tablet by mouth every 12 (twelve) hours. 04/16/18   [provider]  Multiple Vitamin (MULTIVITAMIN) tablet Take 1 tablet by mouth daily.    [provider]  ranitidine (ZANTAC 75) 75 MG tablet Take 1 tablet (75 mg total) by mouth 2 (two) times daily. 05/01/18 05/31/18  Virgel Manifold, MD  sodium chloride (OCEAN) 0.65 % SOLN nasal spray Place 1 spray into both nostrils as needed for congestion.    [provider]    Family History Family History  Problem Relation Age of Onset  . Cancer Mother        colon  . Cancer Sister        lung cancer-hx smoking  . Heart failure Sister     Social History Social History   Tobacco Use  . Smoking  status: Former Research scientist (life sciences)  . Smokeless tobacco: Never Used  Substance Use Topics  . Alcohol use: Yes    Comment: occ.  . Drug use: Never     Allergies   Patient has no known allergies.   Review of Systems Review of Systems  Constitutional: Positive for fever. Negative for chills.  HENT: Positive for congestion. Negative for rhinorrhea and sore throat.   Eyes: Negative for visual disturbance.  Respiratory: Positive for cough and shortness of breath.   Cardiovascular: Negative for chest pain and leg swelling.  Gastrointestinal: Negative for abdominal pain, diarrhea, nausea and vomiting.  Genitourinary: Negative for dysuria.  Musculoskeletal: Positive for myalgias. Negative for back pain and neck  pain.  Skin: Negative for rash.  Neurological: Negative for dizziness, light-headedness and headaches.  Hematological: Does not bruise/bleed easily.  Psychiatric/Behavioral: Negative for confusion.     Physical Exam Updated Vital Signs BP 127/77   Pulse 69   Temp 100 F (37.8 C) (Oral)   Resp (!) 26   Ht 1.651 m (5\' 5" )   Wt 108.9 kg   SpO2 97%   BMI 39.94 kg/m   Physical Exam Vitals signs and nursing note reviewed.  Constitutional:      General: He is not in acute distress.    Appearance: Normal appearance. He is well-developed.  HENT:     Head: Normocephalic and atraumatic.  Eyes:     Extraocular Movements: Extraocular movements intact.     Conjunctiva/sclera: Conjunctivae normal.     Pupils: Pupils are equal, round, and reactive to light.  Neck:     Musculoskeletal: Normal range of motion and neck supple.  Cardiovascular:     Rate and Rhythm: Normal rate and regular rhythm.     Heart sounds: No murmur.  Pulmonary:     Effort: No respiratory distress.     Breath sounds: Normal breath sounds.     Comments: Tachypneic Abdominal:     Palpations: Abdomen is soft.     Tenderness: There is no abdominal tenderness.  Musculoskeletal: Normal range of motion.        General: No swelling.  Skin:    General: Skin is warm and dry.  Neurological:     General: No focal deficit present.     Mental Status: He is alert and oriented to person, place, and time.     Cranial Nerves: No cranial nerve deficit.     Sensory: No sensory deficit.     Motor: No weakness.      ED Treatments / Results  Labs (all labs ordered are listed, but only abnormal results are displayed) Labs Reviewed  COMPREHENSIVE METABOLIC PANEL - Abnormal; Notable for the following components:      Result Value   Glucose, Bld 100 (*)    Calcium 8.5 (*)    Total Protein 8.2 (*)    AST 58 (*)    Anion gap 16 (*)    All other components within normal limits  D-DIMER, QUANTITATIVE (NOT AT [facility]) -  Abnormal; Notable for the following components:   D-Dimer, Quant 5.47 (*)    All other components within normal limits  LACTATE DEHYDROGENASE - Abnormal; Notable for the following components:   LDH 341 (*)    All other components within normal limits  FIBRINOGEN - Abnormal; Notable for the following components:   Fibrinogen 750 (*)    All other components within normal limits  RESPIRATORY PANEL BY RT PCR (FLU A&B, COVID)  CULTURE, BLOOD (ROUTINE X  2)  CULTURE, BLOOD (ROUTINE X 2)  SARS CORONAVIRUS 2 (TAT 6-24 HRS)  CBC WITH DIFFERENTIAL/PLATELET  TRIGLYCERIDES  LACTIC ACID, PLASMA  LACTIC ACID, PLASMA  PROCALCITONIN  FERRITIN  C-REACTIVE PROTEIN  POC SARS CORONAVIRUS 2 AG -  ED    EKG EKG Interpretation  Date/Time:  Wednesday November 18 2019 20:45:47 EST Ventricular Rate:  76 PR Interval:    QRS Duration: 101 QT Interval:  384 QTC Calculation: 432 R Axis:   -20 Text Interpretation: Sinus rhythm Borderline left axis deviation Low voltage, precordial leads Consider anterior infarct Confirmed by Fredia Sorrow [phone]) on 11/18/2019 9:41:10 PM   Radiology Dg Chest Portable 1 View  Result Date: 11/18/2019 CLINICAL DATA:  Shortness of breath and COVID-19 positivity for several days EXAM: PORTABLE CHEST 1 VIEW COMPARISON:  None. FINDINGS: Cardiac shadow is mildly prominent. Postsurgical changes are seen. Patchy airspace opacities are noted left greater than right likely related to the underlying clinical history. No sizable effusion is seen. No bony abnormality is noted. IMPRESSION: Patchy airspace opacities consistent with the given clinical history. Electronically Signed   By: Inez Catalina M.D.   On: 11/18/2019 21:34    Procedures Procedures (including critical care time)  Medications Ordered in ED Medications - No data to display   Initial Impression / Assessment and Plan / ED Course  I have reviewed the triage vital signs and the nursing notes.  Pertinent labs &  imaging results that were available during my care of the patient were reviewed by me and considered in my medical decision making (see chart for details).       Chest x-ray suggestive of COVID-19 infection with patchy infiltrates.  Patient with low-grade fever is had positive Covid test starting on November 30.  Also had one a few days ago.  But our point-of-care testing influenza testing negative here.  We do not have a documented Covid test positive in our system so 6-hour formal test has been ordered.  Patient started on preadmission Covid labs.  Patient requiring oxygen currently at 4 L .  And oxygen sats are in the low 90s.  Patient more comfortable.  Patient not hypotensive.  Not tachycardic.  Based on chest x-ray and clinically patient has a COVID-19.  Labs coming back to her's very suggestive of COVID-19.  Patient will require admission.   Final Clinical Impressions(s) / ED Diagnoses   Final diagnoses:  SOB (shortness of breath)  Hypoxia  COVID-19 virus infection    ED Discharge Orders    None       Fredia Sorrow, MD 11/18/19 2251    Fredia Sorrow, MD 11/18/19 2255

## 2019-11-18 NOTE — ED Triage Notes (Signed)
Pt Covid  Positive x 10 days. C/o sob. EMS called and stated when they arrived, pt was 87% on room air. Pt states SOB gradually getting worse over past 4 days along with his cough.

## 2019-11-18 NOTE — ED Notes (Signed)
Pt on 4L [place] sats 97%.

## 2019-11-18 NOTE — ED Notes (Signed)
Updated pts significant other on pts condition.

## 2019-11-19 ENCOUNTER — Inpatient Hospital Stay (HOSPITAL_COMMUNITY): Payer: Medicare Other

## 2019-11-19 DIAGNOSIS — Z7982 Long term (current) use of aspirin: Secondary | ICD-10-CM | POA: Diagnosis not present

## 2019-11-19 DIAGNOSIS — Z8249 Family history of ischemic heart disease and other diseases of the circulatory system: Secondary | ICD-10-CM | POA: Diagnosis not present

## 2019-11-19 DIAGNOSIS — J129 Viral pneumonia, unspecified: Secondary | ICD-10-CM | POA: Diagnosis not present

## 2019-11-19 DIAGNOSIS — I10 Essential (primary) hypertension: Secondary | ICD-10-CM | POA: Diagnosis present

## 2019-11-19 DIAGNOSIS — J9601 Acute respiratory failure with hypoxia: Secondary | ICD-10-CM | POA: Diagnosis present

## 2019-11-19 DIAGNOSIS — M1990 Unspecified osteoarthritis, unspecified site: Secondary | ICD-10-CM | POA: Diagnosis present

## 2019-11-19 DIAGNOSIS — R0902 Hypoxemia: Secondary | ICD-10-CM

## 2019-11-19 DIAGNOSIS — J069 Acute upper respiratory infection, unspecified: Secondary | ICD-10-CM | POA: Diagnosis not present

## 2019-11-19 DIAGNOSIS — R0602 Shortness of breath: Secondary | ICD-10-CM | POA: Diagnosis not present

## 2019-11-19 DIAGNOSIS — Z951 Presence of aortocoronary bypass graft: Secondary | ICD-10-CM | POA: Diagnosis not present

## 2019-11-19 DIAGNOSIS — I700 Atherosclerosis of aorta: Secondary | ICD-10-CM | POA: Diagnosis present

## 2019-11-19 DIAGNOSIS — E669 Obesity, unspecified: Secondary | ICD-10-CM | POA: Diagnosis present

## 2019-11-19 DIAGNOSIS — E785 Hyperlipidemia, unspecified: Secondary | ICD-10-CM | POA: Diagnosis present

## 2019-11-19 DIAGNOSIS — U071 COVID-19: Secondary | ICD-10-CM | POA: Diagnosis present

## 2019-11-19 DIAGNOSIS — Z87891 Personal history of nicotine dependence: Secondary | ICD-10-CM | POA: Diagnosis not present

## 2019-11-19 DIAGNOSIS — I2510 Atherosclerotic heart disease of native coronary artery without angina pectoris: Secondary | ICD-10-CM | POA: Diagnosis present

## 2019-11-19 DIAGNOSIS — I341 Nonrheumatic mitral (valve) prolapse: Secondary | ICD-10-CM | POA: Diagnosis present

## 2019-11-19 DIAGNOSIS — J1289 Other viral pneumonia: Secondary | ICD-10-CM | POA: Diagnosis present

## 2019-11-19 DIAGNOSIS — Z6839 Body mass index (BMI) 39.0-39.9, adult: Secondary | ICD-10-CM | POA: Diagnosis not present

## 2019-11-19 LAB — LACTIC ACID, PLASMA: Lactic Acid, Venous: 1.6 mmol/L (ref 0.5–1.9)

## 2019-11-19 LAB — INFLUENZA PANEL BY PCR (TYPE A & B)
Influenza A By PCR: NEGATIVE
Influenza B By PCR: NEGATIVE

## 2019-11-19 LAB — SARS CORONAVIRUS 2 (TAT 6-24 HRS): SARS Coronavirus 2: POSITIVE — AB

## 2019-11-19 LAB — ABO/RH: ABO/RH(D): O NEG

## 2019-11-19 LAB — HIV ANTIBODY (ROUTINE TESTING W REFLEX): HIV Screen 4th Generation wRfx: NONREACTIVE

## 2019-11-19 MED ORDER — SODIUM CHLORIDE 0.9 % IV SOLN
250.0000 mL | INTRAVENOUS | Status: DC | PRN
  Administered 2019-11-20: 250 mL via INTRAVENOUS

## 2019-11-19 MED ORDER — METHYLPREDNISOLONE SODIUM SUCC 125 MG IJ SOLR
60.0000 mg | Freq: Two times a day (BID) | INTRAMUSCULAR | Status: DC
  Administered 2019-11-19 – 2019-11-23 (×8): 60 mg via INTRAVENOUS
  Filled 2019-11-19 (×8): qty 2

## 2019-11-19 MED ORDER — ENOXAPARIN SODIUM 40 MG/0.4ML ~~LOC~~ SOLN
40.0000 mg | SUBCUTANEOUS | Status: DC
  Administered 2019-11-19 – 2019-11-21 (×3): 40 mg via SUBCUTANEOUS
  Filled 2019-11-19 (×3): qty 0.4

## 2019-11-19 MED ORDER — IOHEXOL 350 MG/ML SOLN
100.0000 mL | Freq: Once | INTRAVENOUS | Status: AC | PRN
  Administered 2019-11-19: 100 mL via INTRAVENOUS

## 2019-11-19 MED ORDER — ZINC SULFATE 220 (50 ZN) MG PO CAPS
220.0000 mg | ORAL_CAPSULE | Freq: Every day | ORAL | Status: DC
  Administered 2019-11-19 – 2019-11-24 (×6): 220 mg via ORAL
  Filled 2019-11-19 (×6): qty 1

## 2019-11-19 MED ORDER — METOPROLOL TARTRATE 25 MG PO TABS
25.0000 mg | ORAL_TABLET | Freq: Two times a day (BID) | ORAL | Status: DC
  Administered 2019-11-19 – 2019-11-24 (×11): 25 mg via ORAL
  Filled 2019-11-19 (×11): qty 1

## 2019-11-19 MED ORDER — SODIUM CHLORIDE 0.9% FLUSH: 3.0000 mL | INTRAVENOUS | Status: DC | PRN

## 2019-11-19 MED ORDER — SODIUM CHLORIDE 0.9% FLUSH
3.0000 mL | Freq: Two times a day (BID) | INTRAVENOUS | Status: DC
  Administered 2019-11-20 – 2019-11-24 (×9): 3 mL via INTRAVENOUS

## 2019-11-19 MED ORDER — ONDANSETRON HCL 4 MG/2ML IJ SOLN: 4.0000 mg | Freq: Four times a day (QID) | INTRAMUSCULAR | Status: DC | PRN

## 2019-11-19 MED ORDER — ONDANSETRON HCL 4 MG PO TABS: 4.0000 mg | ORAL_TABLET | Freq: Four times a day (QID) | ORAL | Status: DC | PRN

## 2019-11-19 MED ORDER — VITAMIN C 500 MG PO TABS
500.0000 mg | ORAL_TABLET | Freq: Every day | ORAL | Status: DC
  Administered 2019-11-19 – 2019-11-24 (×6): 500 mg via ORAL
  Filled 2019-11-19 (×5): qty 1

## 2019-11-19 MED ORDER — METHYLPREDNISOLONE SODIUM SUCC 125 MG IJ SOLR
INTRAMUSCULAR | Status: AC
  Administered 2019-11-19: 125 mg
  Filled 2019-11-19: qty 2

## 2019-11-19 MED ORDER — ACETAMINOPHEN 500 MG PO TABS
500.0000 mg | ORAL_TABLET | Freq: Four times a day (QID) | ORAL | Status: DC | PRN
  Administered 2019-11-23: 500 mg via ORAL
  Filled 2019-11-19: qty 1

## 2019-11-19 MED ORDER — ATORVASTATIN CALCIUM 40 MG PO TABS
40.0000 mg | ORAL_TABLET | Freq: Every evening | ORAL | Status: DC
  Administered 2019-11-19 – 2019-11-23 (×5): 40 mg via ORAL
  Filled 2019-11-19 (×5): qty 1

## 2019-11-19 MED ORDER — ASPIRIN EC 81 MG PO TBEC
81.0000 mg | DELAYED_RELEASE_TABLET | Freq: Every day | ORAL | Status: DC
  Administered 2019-11-19 – 2019-11-24 (×6): 81 mg via ORAL
  Filled 2019-11-19 (×6): qty 1

## 2019-11-19 MED ORDER — LORATADINE 10 MG PO TABS
10.0000 mg | ORAL_TABLET | Freq: Every evening | ORAL | Status: DC
  Administered 2019-11-19 – 2019-11-23 (×5): 10 mg via ORAL
  Filled 2019-11-19 (×5): qty 1

## 2019-11-19 MED ORDER — ACETAMINOPHEN 325 MG PO TABS
ORAL_TABLET | ORAL | Status: AC
  Administered 2019-11-19: 650 mg
  Filled 2019-11-19: qty 2

## 2019-11-19 MED ORDER — SALINE SPRAY 0.65 % NA SOLN
1.0000 | NASAL | Status: DC | PRN
  Administered 2019-11-19: 1 via NASAL
  Filled 2019-11-19 (×2): qty 44

## 2019-11-19 NOTE — Progress Notes (Signed)
Patient seen and examined. Admitted after midnight secondary to shortness of breath and fever.  Patient reports being tested positive for COVID-19 on November 30 and was [place].  In his weight back to [place] he was once again tested in the community with a positive COVID-19 results; he having quarantine at home but symptoms kept progressing and worsening.  Patient denies any chest pain.  POC negative in the ED; SARS-COV-2-6 24Hr ordered and pending.  Inflammatory markers significantly elevated and chest x-ray demonstrating bilateral atypical infiltrates characteristic of atypical infection.  Patient is currently requiring between 4-6 L nasal cannula supplementation.  His refer to H&P written by Dr. Darrick Meigs on 11/19/2019 for further info/details on admission.  Plan: -Given elevated respiratory rate, D-dimer and hypoxia will check a CT angio of his chest. -Patient not tachycardic but chronically on beta-blocker which could create false presentation. -given presentation, will also check influenza panel. -procalcitonin is less than 0.10 and will hold on antibiotics currently. -if positive COVID repeat test confirmed will start treatment with remdesivir (low threshold to initiate tx). -continue IV steroids -continue supportive care.  Barton Dubois MD [phone]

## 2019-11-19 NOTE — ED Notes (Signed)
Patient denies pain and is resting comfortably.  

## 2019-11-19 NOTE — H&P (Addendum)
TRH H&P    Patient Demographics:    Thomas Hood, is a 63 y.o. male  MRN: [id number]  DOB - [birth year]  Admit Date - 11/18/2019  Referring MD/NP/PA: Aundria Mems  Outpatient Primary MD for the patient is Cletis Athens, MD  Patient coming from: Home  Chief complaint-shortness of breath   HPI:    Thomas Hood  is a 63 y.o. male, with history of CAD s/p CABG, hypertension, mitral valve prolapse, came to hospital with worsening shortness of breath.  Patient was tested positive for COVID-19 initially on November 30th.  He had testing done at [place] where he went for a hunting trip and was tested as negative.  Patient came back to [facility] and was again tested positive last week.  Patient said he has been feeling short of breath, gradually getting worse for past few days.  Still has low-grade fever.    In the ED point-of-care COVID-19 test was negative, rapid SARS-CoV-2 RT-PCR also was negative.  Patient's chest x-ray shows bilateral infiltrates consistent with atypical viral pneumonia.  Inflammatory markers were significantly elevated, with CRP 18.2, lactic acid 1.8, pro calcitonin less than 0.10, ferritin 1082, LDH 341, fibrinogen 750, D-dimer 5.47.  Patient is currently requiring 4 L/min of oxygen.  SARS-CoV-2 6- 24-hour test has been ordered.  Lactic acid is 1.8.  Patient denies chest pain Denies coughing up any phlegm Denies nausea vomiting or diarrhea    Review of systems:    In addition to the HPI above,    All other systems reviewed and are negative.    Past History of the following :    Past Medical History:  Diagnosis Date  . Arthritis   . Atherosclerosis   . CAD (coronary artery disease)   . Dizziness   . HTN (hypertension)   . Mitral valve prolapse       Past Surgical History:  Procedure Laterality Date  . CHOLESTEATOMA EXCISION Right   . COLONOSCOPY     3-4 yrs. ago  Hx  polyps  . COLONOSCOPY WITH PROPOFOL N/A 05/01/2018   Procedure: COLONOSCOPY WITH PROPOFOL;  Surgeon: Virgel Manifold, MD;  Location: [facility] ENDOSCOPY;  Service: Endoscopy;  Laterality: N/A;  . CORONARY ARTERY BYPASS GRAFT    . ESOPHAGOGASTRODUODENOSCOPY (EGD) WITH PROPOFOL N/A 05/01/2018   Procedure: ESOPHAGOGASTRODUODENOSCOPY (EGD) WITH PROPOFOL;  Surgeon: Virgel Manifold, MD;  Location: [facility] ENDOSCOPY;  Service: Endoscopy;  Laterality: N/A;  . TYMPANOPLASTY W/ MASTOIDECTOMY  08/23/2015      Social History:      Social History   Tobacco Use  . Smoking status: Former Research scientist (life sciences)  . Smokeless tobacco: Never Used  Substance Use Topics  . Alcohol use: Yes    Comment: occ.       Family History :     Family History  Problem Relation Age of Onset  . Cancer Mother        colon  . Cancer Sister        lung cancer-hx smoking  . Heart failure Sister  Home Medications:   Prior to Admission medications   Medication Sig Start Date End Date Taking? Authorizing Provider  acetaminophen (TYLENOL) 500 MG tablet Take 500 mg by mouth every 6 (six) hours as needed for mild pain or moderate pain.   Yes [provider]  aspirin EC 81 MG tablet Take by mouth.   Yes [provider]  atorvastatin (LIPITOR) 40 MG tablet Take 40 mg by mouth every evening.    Yes [provider]  loratadine (CLARITIN) 10 MG tablet Take 10 mg by mouth every evening.  03/03/18  Yes [provider]  metoprolol tartrate (LOPRESSOR) 25 MG tablet Take 1 tablet by mouth every 12 (twelve) hours. 04/16/18  Yes [provider]     Allergies:    No Known Allergies   Physical Exam:   Vitals  Blood pressure 127/77, pulse 69, temperature 100 F (37.8 C), temperature source Oral, resp. rate (!) 26, height 5\' 5"  (1.651 m), weight 108.9 kg, SpO2 97 %.  1.  General: Appears in no acute distress  2. Psychiatric: Alert, oriented x3, intact insight and judgment  3.  Neurologic: Cranial nerves II through XII grossly intact, no focal deficit noted  4. HEENMT:  Atraumatic normocephalic, extraocular muscles are intact  5. Respiratory : Bibasilar crackles auscultated  6. Cardiovascular : S1-S2, regular, no murmur auscultated  7. Gastrointestinal:  Abdomen is soft, nontender, no organomegaly      Data Review:    CBC Recent Labs  Lab 11/18/19 2043  WBC 5.8  HGB 14.7  HCT 45.2  PLT 198  MCV 91.9  MCH 29.9  MCHC 32.5  RDW 12.9  LYMPHSABS 1.4  MONOABS 0.3  EOSABS 0.0  BASOSABS 0.0   ------------------------------------------------------------------------------------------------------------------  Results for orders placed or performed during the hospital encounter of 11/18/19 (from the past 48 hour(s))  CBC with Differential     Status: None   Collection Time: 11/18/19  8:43 PM  Result Value Ref Range   WBC 5.8 4.0 - 10.5 K/uL   RBC 4.92 4.22 - 5.81 MIL/uL   Hemoglobin 14.7 13.0 - 17.0 g/dL   HCT 45.2 39.0 - 52.0 %   MCV 91.9 80.0 - 100.0 fL   MCH 29.9 26.0 - 34.0 pg   MCHC 32.5 30.0 - 36.0 g/dL   RDW 12.9 11.5 - 15.5 %   Platelets 198 150 - 400 K/uL   nRBC 0.0 0.0 - 0.2 %   Neutrophils Relative % 71 %   Neutro Abs 4.1 1.7 - 7.7 K/uL   Lymphocytes Relative 24 %   Lymphs Abs 1.4 0.7 - 4.0 K/uL   Monocytes Relative 5 %   Monocytes Absolute 0.3 0.1 - 1.0 K/uL   Eosinophils Relative 0 %   Eosinophils Absolute 0.0 0.0 - 0.5 K/uL   Basophils Relative 0 %   Basophils Absolute 0.0 0.0 - 0.1 K/uL   Immature Granulocytes 0 %   Abs Immature Granulocytes 0.02 0.00 - 0.07 K/uL    Comment: Performed at [facility], [address], [place], [place] [address]  Comprehensive metabolic panel     Status: Abnormal   Collection Time: 11/18/19  8:43 PM  Result Value Ref Range   Sodium 138 135 - 145 mmol/L   Potassium 3.5 3.5 - 5.1 mmol/L   Chloride 100 98 - 111 mmol/L   CO2 22 22 - 32 mmol/L   Glucose, Bld 100 (H) 70 - 99 mg/dL   BUN 15  8 - 23 mg/dL  Creatinine, Ser 1.18 0.61 - 1.24 mg/dL   Calcium 8.5 (L) 8.9 - 10.3 mg/dL   Total Protein 8.2 (H) 6.5 - 8.1 g/dL   Albumin 3.5 3.5 - 5.0 g/dL   AST 58 (H) 15 - 41 U/L   ALT 42 0 - 44 U/L   Alkaline Phosphatase 76 38 - 126 U/L   Total Bilirubin 1.1 0.3 - 1.2 mg/dL   GFR calc non Af Amer >60 >60 mL/min   GFR calc Af Amer >60 >60 mL/min   Anion gap 16 (H) 5 - 15    Comment: Performed at [facility], [address], [place], [place] [address]  D-dimer, quantitative     Status: Abnormal   Collection Time: 11/18/19  9:15 PM  Result Value Ref Range   D-Dimer, Quant 5.47 (H) 0.00 - 0.50 ug/mL-FEU    Comment: (NOTE) At the manufacturer cut-off of 0.50 ug/mL FEU, this assay has been documented to exclude PE with a sensitivity and negative predictive value of 97 to 99%.  At this time, this assay has not been approved by the FDA to exclude DVT/VTE. Results should be correlated with clinical presentation. Performed at [facility], [address] [place], [place] [address]   Procalcitonin     Status: None   Collection Time: 11/18/19  9:15 PM  Result Value Ref Range   Procalcitonin <0.10 ng/mL    Comment:        Interpretation: PCT (Procalcitonin) <= 0.5 ng/mL: Systemic infection (sepsis) is not likely. Local bacterial infection is possible. (NOTE)       Sepsis PCT Algorithm           Lower Respiratory Tract                                      Infection PCT Algorithm    ----------------------------     ----------------------------         PCT < 0.25 ng/mL                PCT < 0.10 ng/mL         Strongly encourage             Strongly discourage   discontinuation of antibiotics    initiation of antibiotics    ----------------------------     -----------------------------       PCT 0.25 - 0.50 ng/mL            PCT 0.10 - 0.25 ng/mL               OR       >80% decrease in PCT            Discourage initiation of                                            antibiotics       Encourage discontinuation           of antibiotics    ----------------------------     -----------------------------         PCT >= 0.50 ng/mL              PCT 0.26 - 0.50 ng/mL               AND        <  80% decrease in PCT             Encourage initiation of                                             antibiotics       Encourage continuation           of antibiotics    ----------------------------     -----------------------------        PCT >= 0.50 ng/mL                  PCT > 0.50 ng/mL               AND         increase in PCT                  Strongly encourage                                      initiation of antibiotics    Strongly encourage escalation           of antibiotics                                     -----------------------------                                           PCT <= 0.25 ng/mL                                                 OR                                        > 80% decrease in PCT                                     Discontinue / Do not initiate                                             antibiotics Performed at [facility], [address], [place], [place] [address]   Lactate dehydrogenase     Status: Abnormal   Collection Time: 11/18/19  9:15 PM  Result Value Ref Range   LDH 341 (H) 98 - 192 U/L    Comment: Performed at [facility], [address] [place], [place] [address]  Ferritin     Status: Abnormal   Collection Time: 11/18/19  9:15 PM  Result Value Ref Range   Ferritin 1,082 (H) 24 - 336 ng/mL    Comment: Performed at [facility], [address], [place], [place] [address]  Triglycerides     Status:  None   Collection Time: 11/18/19  9:15 PM  Result Value Ref Range   Triglycerides 69 <150 mg/dL    Comment: Performed at [facility], [address], [place], [place] [address]  Fibrinogen     Status: Abnormal   Collection Time: 11/18/19  9:15 PM  Result Value Ref Range   Fibrinogen 750 (H) 210 - 475 mg/dL    Comment: Performed at  [facility], [address], [place], [place] [address]  C-reactive protein     Status: Abnormal   Collection Time: 11/18/19  9:15 PM  Result Value Ref Range   CRP 18.2 (H) <1.0 mg/dL    Comment: Performed at [facility], [address] [place], [place] [address]  Respiratory Panel by RT PCR (Flu A&B, Covid) - Nasopharyngeal Swab     Status: None   Collection Time: 11/18/19  9:41 PM   Specimen: Nasopharyngeal Swab  Result Value Ref Range   SARS Coronavirus 2 by RT PCR NEGATIVE NEGATIVE    Comment: (NOTE) SARS-CoV-2 target nucleic acids are NOT DETECTED. The SARS-CoV-2 RNA is generally detectable in upper respiratoy specimens during the acute phase of infection. The lowest concentration of SARS-CoV-2 viral copies this assay can detect is 131 copies/mL. A negative result does not preclude SARS-Cov-2 infection and should not be used as the sole basis for treatment or other patient management decisions. A negative result may occur with  improper specimen collection/handling, submission of specimen other than nasopharyngeal swab, presence of viral mutation(s) within the areas targeted by this assay, and inadequate number of viral copies (<131 copies/mL). A negative result must be combined with clinical observations, patient history, and epidemiological information. The expected result is Negative. Fact Sheet for Patients:  PinkCheek.be Fact Sheet for Healthcare Providers:  GravelBags.it This test is not yet ap proved or cleared by the [place] FDA and  has been authorized for detection and/or diagnosis of SARS-CoV-2 by FDA under an Emergency Use Authorization (EUA). This EUA will remain  in effect (meaning this test can be used) for the duration of the COVID-19 declaration under Section 564(b)(1) of the Act, 21 U.S.C. section 360bbb-3(b)(1), unless the authorization is terminated or revoked sooner.    Influenza A by PCR  NEGATIVE NEGATIVE   Influenza B by PCR NEGATIVE NEGATIVE    Comment: (NOTE) The Xpert Xpress SARS-CoV-2/FLU/RSV assay is intended as an aid in  the diagnosis of influenza from Nasopharyngeal swab specimens and  should not be used as a sole basis for treatment. Nasal washings and  aspirates are unacceptable for Xpert Xpress SARS-CoV-2/FLU/RSV  testing. Fact Sheet for Patients: PinkCheek.be Fact Sheet for Healthcare Providers: GravelBags.it This test is not yet approved or cleared by the [place] FDA and  has been authorized for detection and/or diagnosis of SARS-CoV-2 by  FDA under an Emergency Use Authorization (EUA). This EUA will remain  in effect (meaning this test can be used) for the duration of the  Covid-19 declaration under Section 564(b)(1) of the Act, 21  U.S.C. section 360bbb-3(b)(1), unless the authorization is  terminated or revoked. Performed at [facility], [address] [place], [place] [address]   POC SARS Coronavirus 2 Ag-ED -     Status: None   Collection Time: 11/18/19  9:47 PM  Result Value Ref Range   SARS Coronavirus 2 Ag NEGATIVE NEGATIVE    Comment: (NOTE) SARS-CoV-2 antigen NOT DETECTED.  Negative results are presumptive.  Negative results do not preclude SARS-CoV-2 infection and should not be  used as the sole basis for treatment or other patient management decisions, including infection  control decisions, particularly in the presence of clinical signs and  symptoms consistent with COVID-19, or in those who have been in contact with the virus.  Negative results must be combined with clinical observations, patient history, and epidemiological information. The expected result is Negative. Fact Sheet for Patients: PodPark.tn Fact Sheet for Healthcare Providers: GiftContent.is This test is not yet approved or cleared by the [place] FDA and  has been authorized for detection and/or diagnosis of SARS-CoV-2 by FDA under an Emergency Use Authorization (EUA).  This EUA will remain in effect (meaning this test can be used) for the duration of  the COVID-19 de claration under Section 564(b)(1) of the Act, 21 U.S.C. section 360bbb-3(b)(1), unless the authorization is terminated or revoked sooner.   Lactic acid, plasma     Status: None   Collection Time: 11/18/19 10:29 PM  Result Value Ref Range   Lactic Acid, Venous 1.8 0.5 - 1.9 mmol/L    Comment: Performed at [facility], [address], [place], [place] [address]    Chemistries  Recent Labs  Lab 11/18/19 2043  NA 138  K 3.5  CL 100  CO2 22  GLUCOSE 100*  BUN 15  CREATININE 1.18  CALCIUM 8.5*  AST 58*  ALT 42  ALKPHOS 76  BILITOT 1.1   ------------------------------------------------------------------------------------------------------------------  ------------------------------------------------------------------------------------------------------------------ GFR: Estimated Creatinine Clearance: 71.1 mL/min (by C-G formula based on SCr of 1.18 mg/dL). Liver Function Tests: Recent Labs  Lab 11/18/19 2043  AST 58*  ALT 42  ALKPHOS 76  BILITOT 1.1  PROT 8.2*  ALBUMIN 3.5   Lipid Profile: Recent Labs    11/18/19 2115  TRIG 69   Thyroid Function Tests: No results for input(s): TSH, T4TOTAL, FREET4, T3FREE, THYROIDAB in the last 72 hours. Anemia Panel: Recent Labs    11/18/19 2115  FERRITIN 1,082*    --------------------------------------------------------------------------------------------------------------- Urine analysis: No results found for: COLORURINE, APPEARANCEUR, LABSPEC, PHURINE, GLUCOSEU, HGBUR, BILIRUBINUR, KETONESUR, PROTEINUR, UROBILINOGEN, NITRITE, LEUKOCYTESUR    Imaging Results:    Dg Chest Portable 1 View  Result Date: 11/18/2019 CLINICAL DATA:  Shortness of breath and COVID-19 positivity for several days  EXAM: PORTABLE CHEST 1 VIEW COMPARISON:  None. FINDINGS: Cardiac shadow is mildly prominent. Postsurgical changes are seen. Patchy airspace opacities are noted left greater than right likely related to the underlying clinical history. No sizable effusion is seen. No bony abnormality is noted. IMPRESSION: Patchy airspace opacities consistent with the given clinical history. Electronically Signed   By: Inez Catalina M.D.   On: 11/18/2019 21:34    My personal review of EKG: Rhythm NSR, no ST changes   Assessment & Plan:    Active Problems:   Viral pneumonia   1. Acute hypoxic respiratory failure-viral pneumonia-highly suspicious for COVID-19, though he had 2 rapid tests done in the ED negative.  Will start Solu-Medrol 60 mg IV every 12 hours.  Will await the result of SARS-CoV-2  6-24-hour test.  Will not initiate remdesivir till positive Covid test is obtained.  Procalcitonin less than 0.10.  No role of antibiotics.  Patient is not septic.  2. CAD s/p CABG-stable, continue aspirin, atorvastatin, Lopressor.   DVT Prophylaxis-   Lovenox  AM Labs Ordered, also please review Full Orders  Family Communication: Admission, patients condition and plan of care including tests being ordered have been discussed with the patient  who indicate understanding and agree with the plan  and Code Status.  Code Status: Full code  Admission status: Inpatient: Based on patients clinical presentation and evaluation of above clinical data, I have made determination that patient meets Inpatient criteria at this time.  Time spent in minutes : 60 minutes   Oswald Hillock M.D on 11/19/2019 at 12:15 AM

## 2019-11-20 DIAGNOSIS — J069 Acute upper respiratory infection, unspecified: Secondary | ICD-10-CM

## 2019-11-20 DIAGNOSIS — I10 Essential (primary) hypertension: Secondary | ICD-10-CM

## 2019-11-20 DIAGNOSIS — Z6839 Body mass index (BMI) 39.0-39.9, adult: Secondary | ICD-10-CM

## 2019-11-20 DIAGNOSIS — E6609 Other obesity due to excess calories: Secondary | ICD-10-CM

## 2019-11-20 DIAGNOSIS — E785 Hyperlipidemia, unspecified: Secondary | ICD-10-CM

## 2019-11-20 DIAGNOSIS — U071 COVID-19: Principal | ICD-10-CM | POA: Diagnosis present

## 2019-11-20 DIAGNOSIS — R0602 Shortness of breath: Secondary | ICD-10-CM

## 2019-11-20 LAB — COMPREHENSIVE METABOLIC PANEL
ALT: 53 U/L — ABNORMAL HIGH (ref 0–44)
AST: 59 U/L — ABNORMAL HIGH (ref 15–41)
Albumin: 3.1 g/dL — ABNORMAL LOW (ref 3.5–5.0)
Alkaline Phosphatase: 70 U/L (ref 38–126)
Anion gap: 11 (ref 5–15)
BUN: 25 mg/dL — ABNORMAL HIGH (ref 8–23)
CO2: 22 mmol/L (ref 22–32)
Calcium: 8.7 mg/dL — ABNORMAL LOW (ref 8.9–10.3)
Chloride: 105 mmol/L (ref 98–111)
Creatinine, Ser: 0.99 mg/dL (ref 0.61–1.24)
GFR calc Af Amer: >60 mL/min (ref >60)
GFR calc non Af Amer: >60 mL/min (ref >60)
Glucose, Bld: 199 mg/dL — ABNORMAL HIGH (ref 70–99)
Potassium: 3.8 mmol/L (ref 3.5–5.1)
Sodium: 138 mmol/L (ref 135–145)
Total Bilirubin: 0.8 mg/dL (ref 0.3–1.2)
Total Protein: 7.3 g/dL (ref 6.5–8.1)

## 2019-11-20 LAB — CBC
HCT: 40.2 % (ref 39.0–52.0)
Hemoglobin: 13.8 g/dL (ref 13.0–17.0)
MCH: 30.5 pg (ref 26.0–34.0)
MCHC: 34.3 g/dL (ref 30.0–36.0)
MCV: 88.9 fL (ref 80.0–100.0)
Platelets: 263 10*3/uL (ref 150–400)
RBC: 4.52 MIL/uL (ref 4.22–5.81)
RDW: 12.7 % (ref 11.5–15.5)
WBC: 14.3 10*3/uL — ABNORMAL HIGH (ref 4.0–10.5)
nRBC: 0 % (ref 0.0–0.2)

## 2019-11-20 LAB — D-DIMER, QUANTITATIVE
D-Dimer, Quant: 2.08 ug/mL-FEU — ABNORMAL HIGH (ref 0.00–0.50)
D-Dimer, Quant: 2.36 ug/mL-FEU — ABNORMAL HIGH (ref 0.00–0.50)

## 2019-11-20 LAB — C-REACTIVE PROTEIN
CRP: 13.3 mg/dL — ABNORMAL HIGH (ref <1.0)
CRP: 9 mg/dL — ABNORMAL HIGH (ref <1.0)

## 2019-11-20 LAB — TYPE AND SCREEN
ABO/RH(D): O NEG
Antibody Screen: NEGATIVE

## 2019-11-20 LAB — FERRITIN
Ferritin: 950 ng/mL — ABNORMAL HIGH (ref 24–336)
Ferritin: 893 ng/mL — ABNORMAL HIGH (ref 24–336)

## 2019-11-20 MED ORDER — ACETAMINOPHEN 325 MG PO TABS
650.0000 mg | ORAL_TABLET | Freq: Once | ORAL | Status: AC
  Administered 2019-11-20: 16:00:00 650 mg via ORAL
  Filled 2019-11-20: qty 2

## 2019-11-20 MED ORDER — FUROSEMIDE 10 MG/ML IJ SOLN
40.0000 mg | Freq: Once | INTRAMUSCULAR | Status: AC
  Administered 2019-11-20: 40 mg via INTRAVENOUS
  Filled 2019-11-20: qty 4

## 2019-11-20 MED ORDER — SODIUM CHLORIDE 0.9 % IV SOLN
100.0000 mg | Freq: Every day | INTRAVENOUS | Status: AC
  Administered 2019-11-21 – 2019-11-24 (×4): 100 mg via INTRAVENOUS
  Filled 2019-11-20 (×6): qty 20

## 2019-11-20 MED ORDER — SODIUM CHLORIDE 0.9 % IV SOLN
200.0000 mg | Freq: Once | INTRAVENOUS | Status: AC
  Administered 2019-11-20: 200 mg via INTRAVENOUS
  Filled 2019-11-20: qty 40

## 2019-11-20 MED ORDER — TOCILIZUMAB 400 MG/20ML IV SOLN
800.0000 mg | Freq: Once | INTRAVENOUS | Status: AC
  Administered 2019-11-20: 800 mg via INTRAVENOUS
  Filled 2019-11-20: qty 40

## 2019-11-20 MED ORDER — DIPHENHYDRAMINE HCL 50 MG/ML IJ SOLN
25.0000 mg | Freq: Once | INTRAMUSCULAR | Status: AC
  Administered 2019-11-20: 25 mg via INTRAVENOUS
  Filled 2019-11-20: qty 1

## 2019-11-20 MED ORDER — SODIUM CHLORIDE 0.9% IV SOLUTION
Freq: Once | INTRAVENOUS | Status: AC
  Administered 2019-11-20: 16:00:00 via INTRAVENOUS

## 2019-11-20 NOTE — Progress Notes (Signed)
PROGRESS NOTE    Thomas Hood  [id number] DOB: [birth year] DOA: 11/18/2019 PCP: Cletis Athens, MD     Brief Narrative:  As per H&P written by Dr. Darrick Meigs on 11/19/2019 63 y.o. male, with history of CAD s/p CABG, hypertension, mitral valve prolapse, came to hospital with worsening shortness of breath.  Patient was tested positive for COVID-19 initially on November 30th.  He had testing done at [place] where he went for a hunting trip and was tested as negative.  Patient came back to [facility] and was again tested positive last week.  Patient said he has been feeling short of breath, gradually getting worse for past few days.  Still has low-grade fever.    In the ED point-of-care COVID-19 test was negative, rapid SARS-CoV-2 RT-PCR also was negative.  Patient's chest x-ray shows bilateral infiltrates consistent with atypical viral pneumonia.  Inflammatory markers were significantly elevated, with CRP 18.2, lactic acid 1.8, pro calcitonin less than 0.10, ferritin 1082, LDH 341, fibrinogen 750, D-dimer 5.47.  Patient is currently requiring 4 L/min of oxygen.  SARS-CoV-2 6- 24-hour test has been ordered.  Lactic acid is 1.8.  Assessment & Plan: Acute respiratory failure with hypoxia in the setting of COVID-19 pneumonia -Patient with confirmed Covid test -Started on steroids and remdesivir -Chest x-ray and CT scan demonstrating bilateral patchy infiltrates consistent with atypical pneumonia -No pulmonary embolism appreciated -Inflammatory markers are significantly elevated, except for low procalcitonin and no elevation of his WBCs (atypical viral infection) -CRP trending down but currently still above 7 (13.1) -Following protocol/algorithm of treatment patient will be initiated on Actemra. -Will transfer to [place] for further evaluation and management -Patient requiring 7-9 L high flow nasal cannula supplementation to maintain O2 sats. -Continue zinc, vitamin C and continue  to follow daily inflammatory markers trend  History of coronary artery disease a status post CABG -Patient denies chest pain -EKG is without ischemic changes -Negative troponin -Continue aspirin, Lipitor and beta-blocker.  Essential hypertension -A stable home well-controlled -Continue Lopressor -Heart healthy diet recommended.  Hyperlipidemia -Continue statins -Close monitoring of patient's LFTs while receiving remdesivir and Actemra  Class 2 obesity -Body mass index is 39.94 kg/m. -Calorie diet, portion control and increase physical activity has been discussed with patient.  DVT prophylaxis: Lovenox Code Status: Full code Family Communication: Wife updated over the phone Disposition Plan: Patient ill be transferred to [place] for further evaluation and management of his COVID-19 infection.  Given confirmed  Covid test will continue steroids and remdesivir; CRP is trending down, but higher than 7, per protocol will initiate Actemra tx.   Consultants:   None  Procedures:   See below for x-ray reports  Antimicrobials:  Anti-infectives (From admission, onward)   Start     Dose/Rate Route Frequency Ordered Stop   11/21/19 1000  remdesivir 100 mg in sodium chloride 0.9 % 100 mL IVPB     100 mg 200 mL/hr over 30 Minutes Intravenous Daily 11/20/19 0752 11/25/19 0959   11/20/19 0900  remdesivir 200 mg in sodium chloride 0.9% 250 mL IVPB     200 mg 580 mL/hr over 30 Minutes Intravenous Once 11/20/19 [id number]         Subjective: Patient denies chest pain, no nausea or vomiting.  Continue experiencing significant shortness of breath, difficulty to speak in full sentences and tachypnea; requiring 7-9 L of high flow O2 to maintain O2 sats.  Objective: Vitals:   11/20/19 0700 11/20/19 0745 11/20/19 0754 11/20/19  0800  BP:    (!) 94/59  Pulse: 63     Resp: (!) 22   (!) 22  Temp:   (!) 97.5 F (36.4 C)   TempSrc:   Oral   SpO2: 93% 94%    Weight:      Height:         Intake/Output Summary (Last 24 hours) at 11/20/2019 0852 Last data filed at 11/20/2019 0340 Gross per 24 hour  Intake --  Output 1400 ml  Net -1400 ml   Filed Weights   11/18/19 2036  Weight: 108.9 kg    Examination: General exam: Alert, awake, oriented x 3; chest pain, no nausea, no vomiting.  Patient demonstrating difficulty speaking in full sentences, tachypnea and shortness of breath with minimal exertion.  Requiring high flow nasal cannula supplementation to maintain O2 sats above 90-92%.  Patient is afebrile. Respiratory system: Fair air movement, positive tachypnea, diffuse rhonchi, no wheezing, no frank crackles on examination.  Mildly use of accessory muscles with exertion.   Cardiovascular system: RRR. No murmurs, rubs, gallops. Gastrointestinal system: Abdomen is obese, nondistended, soft and nontender. No organomegaly or masses felt. Normal bowel sounds heard. Central nervous system: Alert and oriented. No focal neurological deficits. Extremities: No cyanosis or clubbing. Skin: No rashes, lesions or ulcers Psychiatry: Judgement and insight appear normal. Mood & affect appropriate.    Data Reviewed: I have personally reviewed following labs and imaging studies  CBC: Recent Labs  Lab 11/18/19 2043  WBC 5.8  NEUTROABS 4.1  HGB 14.7  HCT 45.2  MCV 91.9  PLT [id number]   Basic Metabolic Panel: Recent Labs  Lab 11/18/19 2043  NA 138  K 3.5  CL 100  CO2 22  GLUCOSE 100*  BUN 15  CREATININE 1.18  CALCIUM 8.5*   GFR: Estimated Creatinine Clearance: 71.1 mL/min (by C-G formula based on SCr of 1.18 mg/dL).   Liver Function Tests: Recent Labs  Lab 11/18/19 2043  AST 58*  ALT 42  ALKPHOS 76  BILITOT 1.1  PROT 8.2*  ALBUMIN 3.5   Lipid Profile: Recent Labs    11/18/19 2115  TRIG 69   Anemia Panel: Recent Labs    11/18/19 2115 11/20/19 0515  FERRITIN 1,082* 950*    Recent Results (from the past 240 hour(s))  Respiratory Panel by RT PCR (Flu  A&B, Covid) - Nasopharyngeal Swab     Status: None   Collection Time: 11/18/19  9:41 PM   Specimen: Nasopharyngeal Swab  Result Value Ref Range Status   SARS Coronavirus 2 by RT PCR NEGATIVE NEGATIVE Final    Comment: (NOTE) SARS-CoV-2 target nucleic acids are NOT DETECTED. The SARS-CoV-2 RNA is generally detectable in upper respiratoy specimens during the acute phase of infection. The lowest concentration of SARS-CoV-2 viral copies this assay can detect is 131 copies/mL. A negative result does not preclude SARS-Cov-2 infection and should not be used as the sole basis for treatment or other patient management decisions. A negative result may occur with  improper specimen collection/handling, submission of specimen other than nasopharyngeal swab, presence of viral mutation(s) within the areas targeted by this assay, and inadequate number of viral copies (<131 copies/mL). A negative result must be combined with clinical observations, patient history, and epidemiological information. The expected result is Negative. Fact Sheet for Patients:  PinkCheek.be Fact Sheet for Healthcare Providers:  GravelBags.it This test is not yet ap proved or cleared by the [place] FDA and  has been authorized for detection and/or  diagnosis of SARS-CoV-2 by FDA under an Emergency Use Authorization (EUA). This EUA will remain  in effect (meaning this test can be used) for the duration of the COVID-19 declaration under Section 564(b)(1) of the Act, 21 U.S.C. section 360bbb-3(b)(1), unless the authorization is terminated or revoked sooner.    Influenza A by PCR NEGATIVE NEGATIVE Final   Influenza B by PCR NEGATIVE NEGATIVE Final    Comment: (NOTE) The Xpert Xpress SARS-CoV-2/FLU/RSV assay is intended as an aid in  the diagnosis of influenza from Nasopharyngeal swab specimens and  should not be used as a sole basis for treatment. Nasal washings  and  aspirates are unacceptable for Xpert Xpress SARS-CoV-2/FLU/RSV  testing. Fact Sheet for Patients: PinkCheek.be Fact Sheet for Healthcare Providers: GravelBags.it This test is not yet approved or cleared by the [place] FDA and  has been authorized for detection and/or diagnosis of SARS-CoV-2 by  FDA under an Emergency Use Authorization (EUA). This EUA will remain  in effect (meaning this test can be used) for the duration of the  Covid-19 declaration under Section 564(b)(1) of the Act, 21  U.S.C. section 360bbb-3(b)(1), unless the authorization is  terminated or revoked. Performed at [facility], [address], [place], [place] [address]   SARS CORONAVIRUS 2 (TAT 6-24 HRS) Nasopharyngeal Nasopharyngeal Swab     Status: Abnormal   Collection Time: 11/18/19  9:44 PM   Specimen: Nasopharyngeal Swab  Result Value Ref Range Status   SARS Coronavirus 2 POSITIVE (A) NEGATIVE Final    Comment: RESULT CALLED TO, READ BACK BY AND VERIFIED WITH: Okey Regal RN 15:45 11/19/19 ([email or web address]) (NOTE) SARS-CoV-2 target nucleic acids are DETECTED. The SARS-CoV-2 RNA is generally detectable in upper and lower respiratory specimens during the acute phase of infection. Positive results are indicative of the presence of SARS-CoV-2 RNA. Clinical correlation with patient history and other diagnostic information is  necessary to determine patient infection status. Positive results do not rule out bacterial infection or co-infection with other viruses.  The expected result is Negative. Fact Sheet for Patients: SugarRoll.be Fact Sheet for Healthcare Providers: https://www.woods-mathews.com/ This test is not yet approved or cleared by the [place] FDA and  has been authorized for detection and/or diagnosis of SARS-CoV-2 by FDA under an Emergency Use Authorization (EUA). This EUA will remain  in  effect (meaning this test can be used) for  the duration of the COVID-19 declaration under Section 564(b)(1) of the Act, 21 U.S.C. section 360bbb-3(b)(1), unless the authorization is terminated or revoked sooner. Performed at [facility], [place] [address], [place], [place] [address]   Blood Culture (routine x 2)     Status: None (Preliminary result)   Collection Time: 11/18/19 10:21 PM   Specimen: BLOOD  Result Value Ref Range Status   Specimen Description BLOOD LEFT ANTECUBITAL  Final   Special Requests   Final    BOTTLES DRAWN AEROBIC AND ANAEROBIC Blood Culture adequate volume   Culture   Final    NO GROWTH 2 DAYS Performed at [facility], [address], [place], [place] [address]    Report Status PENDING  Incomplete  Blood Culture (routine x 2)     Status: None (Preliminary result)   Collection Time: 11/18/19 10:29 PM   Specimen: BLOOD  Result Value Ref Range Status   Specimen Description BLOOD LEFT ANTECUBITAL  Final   Special Requests   Final    BOTTLES DRAWN AEROBIC AND ANAEROBIC Blood Culture adequate volume   Culture  Final    NO GROWTH 2 DAYS Performed at [facility], [address], [place], [place] [address]    Report Status PENDING  Incomplete     Radiology Studies: CT ANGIO CHEST PE W OR WO CONTRAST  Result Date: 11/19/2019 CLINICAL DATA:  Worsening shortness of breath 4 days. COVID-19 positive 10 days. Negative COVID-19 test today. Positive D-dimer. EXAM: CT ANGIOGRAPHY CHEST WITH CONTRAST TECHNIQUE: Multidetector CT imaging of the chest was performed using the standard protocol during bolus administration of intravenous contrast. Multiplanar CT image reconstructions and MIPs were obtained to evaluate the vascular anatomy. CONTRAST:  155mL OMNIPAQUE IOHEXOL 350 MG/ML SOLN COMPARISON:  None. FINDINGS: Cardiovascular: Median sternotomy wires are present. Mild cardiomegaly. Calcified plaque over the coronary arteries. Thoracic aorta is normal in caliber without  aneurysm or dissection. Minimal calcified plaque over the aortic arch. Pulmonary arterial system is well opacified without evidence of emboli. Mediastinum/Nodes: 1 cm precarinal lymph node is present. There is a 1.9 cm right subcarinal lymph node no significant hilar adenopathy. Remaining mediastinal structures are unremarkable. Lungs/Pleura: Lungs are adequately inflated and demonstrate a patchy bilateral peripheral predominant airspace process worse over the mid to lower lungs. Findings likely due to multifocal pneumonia. No evidence of effusion 1 cm subpleural granuloma over the posteromedial right lower lobe. Airways are normal. Upper Abdomen: No acute findings. Musculoskeletal: Degenerative change of the spine. Review of the MIP images confirms the above findings. IMPRESSION: 1.  No evidence of pulmonary embolism. 2. Patchy bilateral airspace process likely multifocal pneumonia. Mild associated mediastinal adenopathy likely reactive. 3. Aortic Atherosclerosis (ICD10-I70.0). Atherosclerotic coronary artery disease. Electronically Signed   By: Marin Olp M.D.   On: 11/19/2019 11:27   DG Chest Portable 1 View  Result Date: 11/18/2019 CLINICAL DATA:  Shortness of breath and COVID-19 positivity for several days EXAM: PORTABLE CHEST 1 VIEW COMPARISON:  None. FINDINGS: Cardiac shadow is mildly prominent. Postsurgical changes are seen. Patchy airspace opacities are noted left greater than right likely related to the underlying clinical history. No sizable effusion is seen. No bony abnormality is noted. IMPRESSION: Patchy airspace opacities consistent with the given clinical history. Electronically Signed   By: Inez Catalina M.D.   On: 11/18/2019 21:34    Scheduled Meds: . aspirin EC  81 mg Oral Daily  . atorvastatin  40 mg Oral QPM  . enoxaparin (LOVENOX) injection  40 mg Subcutaneous Q24H  . loratadine  10 mg Oral QPM  . methylPREDNISolone (SOLU-MEDROL) injection  60 mg Intravenous Q12H  . metoprolol  tartrate  25 mg Oral Q12H  . sodium chloride flush  3 mL Intravenous Q12H  . vitamin C  500 mg Oral Daily  . zinc sulfate  220 mg Oral Daily   Continuous Infusions: . sodium chloride    . remdesivir 200 mg in sodium chloride 0.9% 250 mL IVPB     Followed by  . [START ON 11/21/2019] remdesivir 100 mg in NS 100 mL       LOS: 1 day    Time spent: 35 minutes. Greater than 50% of this time was spent in direct contact with the patient, coordinating care and discussing relevant ongoing clinical issues, including acute hypoxemic respiratory failure in the setting of COVID-19 infection; all questions were answered and patient's wife updated over the phone.  Patient will be transferred to [place] for further evaluation and management of his COVID-19 infection; for now continue steroids and remdesivir.   Barton Dubois, MD [facility] Pager [phone]  11/20/2019, 8:52 AM

## 2019-11-20 NOTE — Progress Notes (Signed)
Updated wife on patient condition.

## 2019-11-20 NOTE — Plan of Care (Signed)
Patient in bed on 5 liters O2, stating in the 90's, alert and oriented x4, ambulates well with o2, oriented to call light, room and hospital.

## 2019-11-20 NOTE — ED Notes (Signed)
HOB lowered per pt request- new urinal given to pt. Lights dimmed.

## 2019-11-20 NOTE — ED Notes (Signed)
Dr. Dyann Kief made aware that pt is requiring 9L of high flow O2.

## 2019-11-20 NOTE — ED Notes (Signed)
Pt O2 sats are consistently between 85-90% on 7L O2 via [place]- RT notified of need for high flow oxygen. Pt denies SOB or difficulty breathing at this time.

## 2019-11-20 NOTE — Progress Notes (Signed)
Brief transfer note:  Seen and examined after transfer to [facility].  Was seen by my colleague Dr. Bridgett Larsson earlier.  Currently very comfortable-on around 5-6 L of oxygen.  Plan is to continue steroids and remdesivir.  Did receive Actemra earlier today.  Spoke at length with patient-regarding rationale, risks and benefits of convalescent plasma-he consents.  We will order 1 unit of Covid convalescent plasma.  We will follow closely.

## 2019-11-20 NOTE — Progress Notes (Signed)
RT called to patient room by RN for decreased O2 sats in the 80's.  Pt appears to be resting comfortably. Pt placed on HFNC at 9L o2 sat now 92-93%.  RT will continue to monitor.

## 2019-11-21 DIAGNOSIS — I2510 Atherosclerotic heart disease of native coronary artery without angina pectoris: Secondary | ICD-10-CM

## 2019-11-21 LAB — COMPREHENSIVE METABOLIC PANEL
ALT: 68 U/L — ABNORMAL HIGH (ref 0–44)
AST: 67 U/L — ABNORMAL HIGH (ref 15–41)
Albumin: 3.1 g/dL — ABNORMAL LOW (ref 3.5–5.0)
Alkaline Phosphatase: 73 U/L (ref 38–126)
Anion gap: 14 (ref 5–15)
BUN: 30 mg/dL — ABNORMAL HIGH (ref 8–23)
CO2: 24 mmol/L (ref 22–32)
Calcium: 8.9 mg/dL (ref 8.9–10.3)
Chloride: 103 mmol/L (ref 98–111)
Creatinine, Ser: 0.94 mg/dL (ref 0.61–1.24)
GFR calc Af Amer: >60 mL/min (ref >60)
GFR calc non Af Amer: >60 mL/min (ref >60)
Glucose, Bld: 121 mg/dL — ABNORMAL HIGH (ref 70–99)
Potassium: 3.8 mmol/L (ref 3.5–5.1)
Sodium: 141 mmol/L (ref 135–145)
Total Bilirubin: 0.6 mg/dL (ref 0.3–1.2)
Total Protein: 7.1 g/dL (ref 6.5–8.1)

## 2019-11-21 LAB — CBC
HCT: 41.6 % (ref 39.0–52.0)
Hemoglobin: 13.8 g/dL (ref 13.0–17.0)
MCH: 29.6 pg (ref 26.0–34.0)
MCHC: 33.2 g/dL (ref 30.0–36.0)
MCV: 89.3 fL (ref 80.0–100.0)
Platelets: 290 10*3/uL (ref 150–400)
RBC: 4.66 MIL/uL (ref 4.22–5.81)
RDW: 12.7 % (ref 11.5–15.5)
WBC: 14.6 10*3/uL — ABNORMAL HIGH (ref 4.0–10.5)
nRBC: 0 % (ref 0.0–0.2)

## 2019-11-21 LAB — FERRITIN: Ferritin: 915 ng/mL — ABNORMAL HIGH (ref 24–336)

## 2019-11-21 LAB — GLUCOSE, CAPILLARY
Glucose-Capillary: 235 mg/dL — ABNORMAL HIGH (ref 70–99)
Glucose-Capillary: 209 mg/dL — ABNORMAL HIGH (ref 70–99)

## 2019-11-21 LAB — ABO/RH: ABO/RH(D): O NEG

## 2019-11-21 LAB — C-REACTIVE PROTEIN: CRP: 6.7 mg/dL — ABNORMAL HIGH (ref <1.0)

## 2019-11-21 LAB — D-DIMER, QUANTITATIVE: D-Dimer, Quant: 1.68 ug/mL-FEU — ABNORMAL HIGH (ref 0.00–0.50)

## 2019-11-21 MED ORDER — INSULIN ASPART 100 UNIT/ML ~~LOC~~ SOLN
0.0000 [IU] | Freq: Three times a day (TID) | SUBCUTANEOUS | Status: DC
  Administered 2019-11-21 – 2019-11-22 (×3): 3 [IU] via SUBCUTANEOUS
  Administered 2019-11-22: 2 [IU] via SUBCUTANEOUS
  Administered 2019-11-22: 1 [IU] via SUBCUTANEOUS
  Administered 2019-11-23 – 2019-11-24 (×2): 3 [IU] via SUBCUTANEOUS
  Administered 2019-11-24: 08:00:00 4 [IU] via SUBCUTANEOUS

## 2019-11-21 MED ORDER — ENOXAPARIN SODIUM 60 MG/0.6ML ~~LOC~~ SOLN
55.0000 mg | SUBCUTANEOUS | Status: DC
  Administered 2019-11-22 – 2019-11-24 (×3): 55 mg via SUBCUTANEOUS
  Filled 2019-11-21 (×3): qty 0.6

## 2019-11-21 NOTE — Evaluation (Signed)
Physical Therapy Evaluation Patient Details Name: Thomas Hood MRN: [id number] DOB: 1956-07-10 Today's Date: 11/21/2019   History of Present Illness  63 y.o. male, with history of CAD s/p CABG, hypertension, mitral valve prolapse, came to hospital with worsening shortness of breath.  Patient was tested positive for COVID-19 initially on November 30th in [place]. Returned home and continued to worsen, presented to ED 11/19/19. Initial COVID tests negative in ED with 24 hr test showing positive.   Clinical Impression  Pt admitted with above diagnosis. Patient saturating 95% on room air at rest, however desaturates to 84% on room air with slow recovery requiring re-applying [place] O2. Pulse oximeter moved from patient's finger to pt's earlobe (despite good pleth, as his appearance did not match the saturation of 86-87%. With pulse ox on his earlobe, he did desaturate to 87% on room air and immediately placed on 2L. Patient was then able to maintain sats >90% while ambulating. Pt currently with functional limitations due to the deficits listed below (see PT Problem List). Pt will benefit from skilled PT to increase their independence and safety with mobility to allow discharge to the venue listed below.       Follow Up Recommendations No PT follow up    Equipment Recommendations  None recommended by PT    Recommendations for Other Services OT consult     Precautions / Restrictions Precautions Precautions: Other (comment) Precaution Comments: desaturates with activity      Mobility  Bed Mobility Overal bed mobility: Independent                Transfers Overall transfer level: Independent Equipment used: None             General transfer comment: x3 multiple surfaces  Ambulation/Gait Ambulation/Gait assistance: Supervision Gait Distance (Feet): 75 Feet(standing rest; 75; seated rest, 75) Assistive device: None Gait Pattern/deviations: Step-through pattern;Decreased  stride length     General Gait Details: slower with wide BOS due to body habitus  Stairs            Wheelchair Mobility    Modified Rankin (Stroke Patients Only)       Balance Overall balance assessment: Independent                                           Pertinent Vitals/Pain Pain Assessment: No/denies pain    Home Living Family/patient expects to be discharged to:: Private residence Living Arrangements: Spouse/significant other Available Help at Discharge: Family;Available 24 hours/day(wife also has COVID (at home)) Type of Home: House Home Access: Stairs to enter   [employer] of Steps: 5 Home Layout: Two level;Able to live on main level with bedroom/bathroom Home Equipment: None      Prior Function Level of Independence: Independent         Comments: denies imbalance prior to illness     Hand Dominance        Extremity/Trunk Assessment   Upper Extremity Assessment Upper Extremity Assessment: Defer to OT evaluation    Lower Extremity Assessment Lower Extremity Assessment: Overall WFL for tasks assessed    Cervical / Trunk Assessment Cervical / Trunk Assessment: Other exceptions Cervical / Trunk Exceptions: obesity  Communication   Communication: No difficulties  Cognition Arousal/Alertness: Awake/alert Behavior During Therapy: WFL for tasks assessed/performed Overall Cognitive Status: Within Functional Limits for tasks assessed  General Comments General comments (skin integrity, edema, etc.): Patient on room air with sats 95% per finger pulse oximetry. Ambulated on room air with mild shortness of breath and standing rest x 2 minutes. On return to room, pt's sats reading 84% with good waveform. Seated rest with pursed lip breathing with sats remaining 85-86% after 4 minutes. Slowly increased from room air to 4L over ~10 minutes with no improvement in sats  (even with change in probe and change in machine). RN called and in to assist. Moved pulse ox to pt's earlobe and immediately moved from 87% to 95% on 4L. Gradually returned to room air. Assessed ambulatory sats after pt's sats stabilized on room air. (See separate O2 saturation note).     Exercises Other Exercises Other Exercises: Educated and pt demonstrated use of IS; required cues for slower breath and pt then with difficulty pulling >1500 ml.  Other Exercises: Encouraged sit to stand x 5-10 reps prior to meals; educated in LAQ when seated. Encouraged to request nursing assist him with longer walks (RN also educated and agreed).    Assessment/Plan    PT Assessment Patient needs continued PT services  PT Problem List Decreased activity tolerance;Decreased mobility;Cardiopulmonary status limiting activity;Obesity       PT Treatment Interventions Gait training;Functional mobility training;Therapeutic activities;Therapeutic exercise;Patient/family education    PT Goals (Current goals can be found in the Care Plan section)  Acute Rehab PT Goals Patient Stated Goal: not need oxygen at home PT Goal Formulation: With patient Time For Goal Achievement: 12/05/19 Potential to Achieve Goals: Good    Frequency Min 3X/week   Barriers to discharge        Co-evaluation               AM-PAC PT "6 Clicks" Mobility  Outcome Measure Help needed turning from your back to your side while in a flat bed without using bedrails?: None Help needed moving from lying on your back to sitting on the side of a flat bed without using bedrails?: None Help needed moving to and from a bed to a chair (including a wheelchair)?: None Help needed standing up from a chair using your arms (e.g., wheelchair or bedside chair)?: None Help needed to walk in hospital room?: A Little Help needed climbing 3-5 steps with a railing? : A Little 6 Click Score: 22    End of Session Equipment Utilized During Treatment:  Oxygen Activity Tolerance: Treatment limited secondary to medical complications (Comment) Patient left: in chair;with call bell/phone within reach Nurse Communication: Mobility status;Other (comment)(desaturation) PT Visit Diagnosis: Difficulty in walking, not elsewhere classified (R26.2)    Time:  -      11/21/19 1805  PT Visit Information  Last PT Received On 11/21/19  PT Time Calculation  PT Start Time (ACUTE ONLY) 1700  PT Stop Time (ACUTE ONLY) 1750  PT Time Calculation (min) (ACUTE ONLY) 50 min  PT General Charges  $$ ACUTE PT VISIT 1 Visit  PT Evaluation  $PT Eval Low Complexity 1 Low  PT Treatments  $Gait Training 23-37 mins   Charges:               Barry Brunner, PT Pager [phone]   Rexanne Mano 11/21/2019, 10:40 PM

## 2019-11-21 NOTE — Progress Notes (Signed)
PROGRESS NOTE                                                                                                                                                                                                             Patient Demographics:    Thomas Hood, is a 63 y.o. male, DOB - 11/02/1956, [id number]  Outpatient Primary MD for the patient is Cletis Athens, MD   Admit date - 11/18/2019   LOS - 2  Chief Complaint  Patient presents with  . Shortness of Breath       Brief Narrative: Patient is a 63 y.o. male with PMHx of CAD s/p CABG, HTN-who presented with worsening shortness of breath, he was found to have acute hypoxic respiratory failure in the setting of COVID-19 pneumonia.   Subjective:    Edsil Irigoyen today feels much better-he has been titrated to room air.  Denies any chest pain or shortness of breath.   Assessment  & Plan :   Acute Hypoxic Resp Failure due to Covid 19 Viral pneumonia: Improved-has been transitioned to room air this morning.  Continue steroids/remdesivir.  Patient is s/p Actemra and convalescent plasma yesterday.  Fever: afebrile  O2 requirements:  SpO2: 94 % O2 Flow Rate (L/min): 2 L/min   COVID-19 Labs: Recent Labs    11/18/19 2115 11/20/19 0515 11/20/19 1521 11/21/19 0145  DDIMER 5.47* 2.08* 2.36* 1.68*  FERRITIN 1,082* 950* 893* 915*  LDH 341*  --   --   --   CRP 18.2* 13.3* 9.0* 6.7*    No results found for: BNP  Recent Labs  Lab 11/18/19 2115  PROCALCITON <0.10    Lab Results  Component Value Date   SARSCOV2NAA POSITIVE (A) 11/18/2019   [place] NEGATIVE 11/18/2019     COVID-19 Medications: Steroids: 12/9>> Remdesivir: 12/11>> Actemra: 12/11 x 1 Convalescent Plasma: 12/11 x 1  Other medications: Diuretics:Euvolemic-no need for lasix Antibiotics:Not needed as no evidence of bacterial infection Insulin: Monitor CBGs closely while on SSI.  Check A1c.     Prone/Incentive Spirometry: encouraged incentive spirometry use 3-4/hour.  DVT Prophylaxis  :  Lovenox   CAD s/p CABG: No anginal symptoms-continue aspirin, beta-blocker and statin.  HTN: Controlled-continue Lopressor  Obesity: Estimated body mass index is 39.94 kg/m as calculated from the following:   Height as of this encounter: 5\' 5"  (  1.651 m).   Weight as of this encounter: 108.9 kg.    Consults  :  None  Procedures  :  None  ABG: No results found for: PHART, PCO2ART, PO2ART, HCO3, TCO2, ACIDBASEDEF, O2SAT  Vent Settings: N/A  Condition - Stable  Family Communication  :  Spouse updated over the phone  Code Status :  Full Code  Diet :  Diet Order            Diet regular Room service appropriate? Yes; Fluid consistency: Thin  Diet effective now               Disposition Plan  :  Remain hospitalized  Barriers to discharge: Complete 5 days of IV Remdesivir  Antimicorbials  :    Anti-infectives (From admission, onward)   Start     Dose/Rate Route Frequency Ordered Stop   11/21/19 1000  remdesivir 100 mg in sodium chloride 0.9 % 100 mL IVPB     100 mg 200 mL/hr over 30 Minutes Intravenous Daily 11/20/19 0752 11/25/19 0959   11/20/19 0900  remdesivir 200 mg in sodium chloride 0.9% 250 mL IVPB     200 mg 580 mL/hr over 30 Minutes Intravenous Once 11/20/19 0752 11/20/19 1032      Inpatient Medications  Scheduled Meds: . aspirin EC  81 mg Oral Daily  . atorvastatin  40 mg Oral QPM  . enoxaparin (LOVENOX) injection  40 mg Subcutaneous Q24H  . loratadine  10 mg Oral QPM  . methylPREDNISolone (SOLU-MEDROL) injection  60 mg Intravenous Q12H  . metoprolol tartrate  25 mg Oral Q12H  . sodium chloride flush  3 mL Intravenous Q12H  . vitamin C  500 mg Oral Daily  . zinc sulfate  220 mg Oral Daily   Continuous Infusions: . sodium chloride 250 mL (11/20/19 1002)  . remdesivir 100 mg in NS 100 mL 100 mg (11/21/19 0938)   PRN Meds:.sodium chloride,  acetaminophen, ondansetron **OR** ondansetron (ZOFRAN) IV, sodium chloride, sodium chloride flush   Time Spent in minutes  25  See all Orders from today for further details   Oren Binet M.D on 11/21/2019 at 12:09 PM  To page go to [email or web address] - use universal password  [facility] -  Office  [phone]    Objective:   Vitals:   11/21/19 0550 11/21/19 0631 11/21/19 0805 11/21/19 0825  BP: 115/74 119/67  (!) 98/59  Pulse: (!) 59 63  68  Resp: (!) 23 20  20   Temp: (!) 97.4 F (36.3 C) 97.6 F (36.4 C) (!) 97.3 F (36.3 C)   TempSrc: Oral Oral    SpO2: 93% 93%  94%  Weight:      Height:        Wt Readings from Last 3 Encounters:  11/18/19 108.9 kg  05/01/18 108.9 kg  04/17/18 109.5 kg     Intake/Output Summary (Last 24 hours) at 11/21/2019 1209 Last data filed at 11/21/2019 1029 Gross per 24 hour  Intake 1710 ml  Output 1225 ml  Net 485 ml     Physical Exam Gen Exam:Alert awake-not in any distress HEENT:atraumatic, normocephalic Chest: B/L clear to auscultation anteriorly CVS:S1S2 regular Abdomen:soft non tender, non distended Extremities:no edema Neurology: Non focal Skin: no rash   Data Review:    CBC Recent Labs  Lab 11/18/19 2043 11/20/19 1521 11/21/19 0145  WBC 5.8 14.3* 14.6*  HGB 14.7 13.8 13.8  HCT 45.2 40.2 41.6  PLT 198 263 290  MCV 91.9  88.9 89.3  MCH 29.9 30.5 29.6  MCHC 32.5 34.3 33.2  RDW 12.9 12.7 12.7  LYMPHSABS 1.4  --   --   MONOABS 0.3  --   --   EOSABS 0.0  --   --   BASOSABS 0.0  --   --     Chemistries  Recent Labs  Lab 11/18/19 2043 11/20/19 1521 11/21/19 0145  NA 138 138 141  K 3.5 3.8 3.8  CL 100 105 103  CO2 22 22 24   GLUCOSE 100* 199* 121*  BUN 15 25* 30*  CREATININE 1.18 0.99 0.94  CALCIUM 8.5* 8.7* 8.9  AST 58* 59* 67*  ALT 42 53* 68*  ALKPHOS 76 70 73  BILITOT 1.1 0.8 0.6    ------------------------------------------------------------------------------------------------------------------ Recent Labs    11/18/19 2115  TRIG 69    No results found for: HGBA1C ------------------------------------------------------------------------------------------------------------------ No results for input(s): TSH, T4TOTAL, T3FREE, THYROIDAB in the last 72 hours.  Invalid input(s): FREET3 ------------------------------------------------------------------------------------------------------------------ Recent Labs    11/20/19 1521 11/21/19 0145  FERRITIN 893* 915*    Coagulation profile No results for input(s): INR, PROTIME in the last 168 hours.  Recent Labs    11/20/19 1521 11/21/19 0145  DDIMER 2.36* 1.68*    Cardiac Enzymes No results for input(s): CKMB, TROPONINI, MYOGLOBIN in the last 168 hours.  Invalid input(s): CK ------------------------------------------------------------------------------------------------------------------ No results found for: BNP  Micro Results Recent Results (from the past 240 hour(s))  Respiratory Panel by RT PCR (Flu A&B, Covid) - Nasopharyngeal Swab     Status: None   Collection Time: 11/18/19  9:41 PM   Specimen: Nasopharyngeal Swab  Result Value Ref Range Status   SARS Coronavirus 2 by RT PCR NEGATIVE NEGATIVE Final    Comment: (NOTE) SARS-CoV-2 target nucleic acids are NOT DETECTED. The SARS-CoV-2 RNA is generally detectable in upper respiratoy specimens during the acute phase of infection. The lowest concentration of SARS-CoV-2 viral copies this assay can detect is 131 copies/mL. A negative result does not preclude SARS-Cov-2 infection and should not be used as the sole basis for treatment or other patient management decisions. A negative result may occur with  improper specimen collection/handling, submission of specimen other than nasopharyngeal swab, presence of viral mutation(s) within the areas targeted  by this assay, and inadequate number of viral copies (<131 copies/mL). A negative result must be combined with clinical observations, patient history, and epidemiological information. The expected result is Negative. Fact Sheet for Patients:  PinkCheek.be Fact Sheet for Healthcare Providers:  GravelBags.it This test is not yet ap proved or cleared by the [place] FDA and  has been authorized for detection and/or diagnosis of SARS-CoV-2 by FDA under an Emergency Use Authorization (EUA). This EUA will remain  in effect (meaning this test can be used) for the duration of the COVID-19 declaration under Section 564(b)(1) of the Act, 21 U.S.C. section 360bbb-3(b)(1), unless the authorization is terminated or revoked sooner.    Influenza A by PCR NEGATIVE NEGATIVE Final   Influenza B by PCR NEGATIVE NEGATIVE Final    Comment: (NOTE) The Xpert Xpress SARS-CoV-2/FLU/RSV assay is intended as an aid in  the diagnosis of influenza from Nasopharyngeal swab specimens and  should not be used as a sole basis for treatment. Nasal washings and  aspirates are unacceptable for Xpert Xpress SARS-CoV-2/FLU/RSV  testing. Fact Sheet for Patients: PinkCheek.be Fact Sheet for Healthcare Providers: GravelBags.it This test is not yet approved or cleared by the [place] FDA and  has been  authorized for detection and/or diagnosis of SARS-CoV-2 by  FDA under an Emergency Use Authorization (EUA). This EUA will remain  in effect (meaning this test can be used) for the duration of the  Covid-19 declaration under Section 564(b)(1) of the Act, 21  U.S.C. section 360bbb-3(b)(1), unless the authorization is  terminated or revoked. Performed at [facility], [address] [place], [place] [address]   SARS CORONAVIRUS 2 (TAT 6-24 HRS) Nasopharyngeal Nasopharyngeal Swab     Status: Abnormal    Collection Time: 11/18/19  9:44 PM   Specimen: Nasopharyngeal Swab  Result Value Ref Range Status   SARS Coronavirus 2 POSITIVE (A) NEGATIVE Final    Comment: RESULT CALLED TO, READ BACK BY AND VERIFIED WITH: Okey Regal RN 15:45 11/19/19 ([email or web address]) (NOTE) SARS-CoV-2 target nucleic acids are DETECTED. The SARS-CoV-2 RNA is generally detectable in upper and lower respiratory specimens during the acute phase of infection. Positive results are indicative of the presence of SARS-CoV-2 RNA. Clinical correlation with patient history and other diagnostic information is  necessary to determine patient infection status. Positive results do not rule out bacterial infection or co-infection with other viruses.  The expected result is Negative. Fact Sheet for Patients: SugarRoll.be Fact Sheet for Healthcare Providers: https://www.woods-mathews.com/ This test is not yet approved or cleared by the [place] FDA and  has been authorized for detection and/or diagnosis of SARS-CoV-2 by FDA under an Emergency Use Authorization (EUA). This EUA will remain  in effect (meaning this test can be used) for  the duration of the COVID-19 declaration under Section 564(b)(1) of the Act, 21 U.S.C. section 360bbb-3(b)(1), unless the authorization is terminated or revoked sooner. Performed at [facility], [place] [address] [place], [place] [address]   Blood Culture (routine x 2)     Status: None (Preliminary result)   Collection Time: 11/18/19 10:21 PM   Specimen: BLOOD  Result Value Ref Range Status   Specimen Description BLOOD LEFT ANTECUBITAL  Final   Special Requests   Final    BOTTLES DRAWN AEROBIC AND ANAEROBIC Blood Culture adequate volume   Culture   Final    NO GROWTH 3 DAYS Performed at [facility], [address], [place], [place] [address]    Report Status PENDING  Incomplete  Blood Culture (routine x 2)     Status: None (Preliminary result)    Collection Time: 11/18/19 10:29 PM   Specimen: BLOOD  Result Value Ref Range Status   Specimen Description BLOOD LEFT ANTECUBITAL  Final   Special Requests   Final    BOTTLES DRAWN AEROBIC AND ANAEROBIC Blood Culture adequate volume   Culture   Final    NO GROWTH 3 DAYS Performed at [facility], [address], [place], [place] [address]    Report Status PENDING  Incomplete    Radiology Reports CT ANGIO CHEST PE W OR WO CONTRAST  Result Date: 11/19/2019 CLINICAL DATA:  Worsening shortness of breath 4 days. COVID-19 positive 10 days. Negative COVID-19 test today. Positive D-dimer. EXAM: CT ANGIOGRAPHY CHEST WITH CONTRAST TECHNIQUE: Multidetector CT imaging of the chest was performed using the standard protocol during bolus administration of intravenous contrast. Multiplanar CT image reconstructions and MIPs were obtained to evaluate the vascular anatomy. CONTRAST:  166mL OMNIPAQUE IOHEXOL 350 MG/ML SOLN COMPARISON:  None. FINDINGS: Cardiovascular: Median sternotomy wires are present. Mild cardiomegaly. Calcified plaque over the coronary arteries. Thoracic aorta is normal in caliber without aneurysm or dissection. Minimal calcified plaque over the aortic arch. Pulmonary  arterial system is well opacified without evidence of emboli. Mediastinum/Nodes: 1 cm precarinal lymph node is present. There is a 1.9 cm right subcarinal lymph node no significant hilar adenopathy. Remaining mediastinal structures are unremarkable. Lungs/Pleura: Lungs are adequately inflated and demonstrate a patchy bilateral peripheral predominant airspace process worse over the mid to lower lungs. Findings likely due to multifocal pneumonia. No evidence of effusion 1 cm subpleural granuloma over the posteromedial right lower lobe. Airways are normal. Upper Abdomen: No acute findings. Musculoskeletal: Degenerative change of the spine. Review of the MIP images confirms the above findings. IMPRESSION: 1.  No evidence of pulmonary  embolism. 2. Patchy bilateral airspace process likely multifocal pneumonia. Mild associated mediastinal adenopathy likely reactive. 3. Aortic Atherosclerosis (ICD10-I70.0). Atherosclerotic coronary artery disease. Electronically Signed   By: Marin Olp M.D.   On: 11/19/2019 11:27   DG Chest Portable 1 View  Result Date: 11/18/2019 CLINICAL DATA:  Shortness of breath and COVID-19 positivity for several days EXAM: PORTABLE CHEST 1 VIEW COMPARISON:  None. FINDINGS: Cardiac shadow is mildly prominent. Postsurgical changes are seen. Patchy airspace opacities are noted left greater than right likely related to the underlying clinical history. No sizable effusion is seen. No bony abnormality is noted. IMPRESSION: Patchy airspace opacities consistent with the given clinical history. Electronically Signed   By: Inez Catalina M.D.   On: 11/18/2019 21:34

## 2019-11-21 NOTE — Progress Notes (Signed)
Spoke with patient wife Thomas Hood and updated on patient's new location and his condition/plan of care. Answered questions and concerns. Pt wife expressed gratitude for his care.

## 2019-11-21 NOTE — Progress Notes (Signed)
SATURATION QUALIFICATIONS: (This note is used to comply with regulatory documentation for home oxygen)  Patient Saturations on Room Air at Rest = 95%  Patient Saturations on Room Air while Ambulating = 84%  Patient Saturations on 2 Liters of oxygen while Ambulating = 90%  Please briefly explain why patient needs home oxygen:  To reduce shortness of breath and improve tolerance for activity (standing, walking, ADLs); to maintain sats >87%.    Barry Brunner, PT Pager [phone]

## 2019-11-21 NOTE — Progress Notes (Signed)
Patient arrived to unit and placed in room 308. Previous RN gave bedside report and pt given call bell and phone and instructed to use if needs arise.

## 2019-11-22 LAB — COMPREHENSIVE METABOLIC PANEL
ALT: 76 U/L — ABNORMAL HIGH (ref 0–44)
AST: 58 U/L — ABNORMAL HIGH (ref 15–41)
Albumin: 2.8 g/dL — ABNORMAL LOW (ref 3.5–5.0)
Alkaline Phosphatase: 77 U/L (ref 38–126)
Anion gap: 9 (ref 5–15)
BUN: 32 mg/dL — ABNORMAL HIGH (ref 8–23)
CO2: 25 mmol/L (ref 22–32)
Calcium: 8.7 mg/dL — ABNORMAL LOW (ref 8.9–10.3)
Chloride: 107 mmol/L (ref 98–111)
Creatinine, Ser: 0.86 mg/dL (ref 0.61–1.24)
GFR calc Af Amer: >60 mL/min (ref >60)
GFR calc non Af Amer: >60 mL/min (ref >60)
Glucose, Bld: 140 mg/dL — ABNORMAL HIGH (ref 70–99)
Potassium: 4.4 mmol/L (ref 3.5–5.1)
Sodium: 141 mmol/L (ref 135–145)
Total Bilirubin: 0.7 mg/dL (ref 0.3–1.2)
Total Protein: 6.6 g/dL (ref 6.5–8.1)

## 2019-11-22 LAB — D-DIMER, QUANTITATIVE: D-Dimer, Quant: 1.29 ug/mL-FEU — ABNORMAL HIGH (ref 0.00–0.50)

## 2019-11-22 LAB — CBC
HCT: 40.7 % (ref 39.0–52.0)
Hemoglobin: 13.7 g/dL (ref 13.0–17.0)
MCH: 30 pg (ref 26.0–34.0)
MCHC: 33.7 g/dL (ref 30.0–36.0)
MCV: 89.1 fL (ref 80.0–100.0)
Platelets: 299 10*3/uL (ref 150–400)
RBC: 4.57 MIL/uL (ref 4.22–5.81)
RDW: 13 % (ref 11.5–15.5)
WBC: 13.5 10*3/uL — ABNORMAL HIGH (ref 4.0–10.5)
nRBC: 0 % (ref 0.0–0.2)

## 2019-11-22 LAB — GLUCOSE, CAPILLARY
Glucose-Capillary: 139 mg/dL — ABNORMAL HIGH (ref 70–99)
Glucose-Capillary: 209 mg/dL — ABNORMAL HIGH (ref 70–99)
Glucose-Capillary: 221 mg/dL — ABNORMAL HIGH (ref 70–99)

## 2019-11-22 LAB — C-REACTIVE PROTEIN: CRP: 2.7 mg/dL — ABNORMAL HIGH (ref <1.0)

## 2019-11-22 LAB — FERRITIN: Ferritin: 678 ng/mL — ABNORMAL HIGH (ref 24–336)

## 2019-11-22 MED ORDER — FUROSEMIDE 10 MG/ML IJ SOLN
40.0000 mg | Freq: Every day | INTRAMUSCULAR | Status: DC
  Administered 2019-11-22 – 2019-11-24 (×3): 40 mg via INTRAVENOUS
  Filled 2019-11-22 (×3): qty 4

## 2019-11-22 NOTE — Progress Notes (Signed)
SATURATION QUALIFICATIONS: (This note is used to comply with regulatory documentation for home oxygen)  Patient Saturations on Room Air at Rest = 94%  Patient Saturations on Room Air while Ambulating = 84%  Patient Saturations on 2 Liters of oxygen while Ambulating = 88%  Please briefly explain why patient needs home oxygen:sob after walking down the hallway twice.

## 2019-11-22 NOTE — Progress Notes (Addendum)
PROGRESS NOTE                                                                                                                                                                                                             Patient Demographics:    Thomas Hood, is a 63 y.o. male, DOB - Jul 29, 1956, [id number]  Outpatient Primary MD for the patient is Cletis Athens, MD   Admit date - 11/18/2019   LOS - 3  Chief Complaint  Patient presents with   Shortness of Breath       Brief Narrative: Patient is a 63 y.o. male with PMHx of CAD s/p CABG, HTN-who presented with worsening shortness of breath, he was found to have acute hypoxic respiratory failure in the setting of COVID-19 pneumonia.   Subjective:    Thomas Hood was sitting up in bed-no complaints.   Assessment  & Plan :   Acute Hypoxic Resp Failure due to Covid 19 Viral pneumonia: Significantly improved-continue steroids/remdesivir.  Patient is also s/p Actemra x1, and convalescent plasma infusion.  Plan to complete fourth dose of remdesivir on 12/14, he is agreeable to come to the infusion center on 12/15 to complete his last dose.   Assess for home O2 requirement prior to discharge.   Fever: afebrile  O2 requirements:  SpO2: (!) 89 % O2 Flow Rate (L/min): 2 L/min   COVID-19 Labs: Recent Labs    11/20/19 1521 11/21/19 0145 11/22/19 0700  DDIMER 2.36* 1.68* 1.29*  FERRITIN 893* 915* 678*  CRP 9.0* 6.7* 2.7*    No results found for: BNP  Recent Labs  Lab 11/18/19 2115  PROCALCITON <0.10    Lab Results  Component Value Date   SARSCOV2NAA POSITIVE (A) 11/18/2019   Person NEGATIVE 11/18/2019     COVID-19 Medications: Steroids: 12/9>> Remdesivir: 12/11>> Actemra: 12/11 x 1 Convalescent Plasma: 12/11 x 1  Other medications: Diuretics:Euvolemic-give Lasix 40 mg IV daily today. Antibiotics:Not needed as no evidence of bacterial  infection Insulin: CBG stable on SSI-monitor closely while on steroids.  A1c pending.  Prone/Incentive Spirometry: encouraged incentive spirometry use 3-4/hour.  DVT Prophylaxis  :  Lovenox   CAD s/p CABG: No anginal symptoms-continue aspirin, beta-blocker and statin.  HTN: Controlled-continue Lopressor  Obesity: Estimated body mass index is 39.94 kg/m as calculated from the following:   Height as  of this encounter: 5\' 5"  (1.651 m).   Weight as of this encounter: 108.9 kg.    Consults  :  None  Procedures  :  None  ABG: No results found for: PHART, PCO2ART, PO2ART, HCO3, TCO2, ACIDBASEDEF, O2SAT  Vent Settings: N/A  Condition - Stable  Family Communication  :  Spouse updated over the phone on 12/13  Code Status :  Full Code  Diet :  Diet Order            Diet regular Room service appropriate? Yes; Fluid consistency: Thin  Diet effective now               Disposition Plan  :  Remain hospitalized  Barriers to discharge: Complete 5 days of IV Remdesivir  Antimicorbials  :    Anti-infectives (From admission, onward)   Start     Dose/Rate Route Frequency Ordered Stop   11/21/19 1000  remdesivir 100 mg in sodium chloride 0.9 % 100 mL IVPB     100 mg 200 mL/hr over 30 Minutes Intravenous Daily 11/20/19 0752 11/25/19 0959   11/20/19 0900  remdesivir 200 mg in sodium chloride 0.9% 250 mL IVPB     200 mg 580 mL/hr over 30 Minutes Intravenous Once 11/20/19 0752 11/20/19 1032      Inpatient Medications  Scheduled Meds:  aspirin EC  81 mg Oral Daily   atorvastatin  40 mg Oral QPM   enoxaparin (LOVENOX) injection  55 mg Subcutaneous Q24H   insulin aspart  0-9 Units Subcutaneous TID WC   loratadine  10 mg Oral QPM   methylPREDNISolone (SOLU-MEDROL) injection  60 mg Intravenous Q12H   metoprolol tartrate  25 mg Oral Q12H   sodium chloride flush  3 mL Intravenous Q12H   vitamin C  500 mg Oral Daily   zinc sulfate  220 mg Oral Daily   Continuous  Infusions:  sodium chloride 250 mL (11/20/19 1002)   remdesivir 100 mg in NS 100 mL 100 mg (11/22/19 1106)   PRN Meds:.sodium chloride, acetaminophen, ondansetron **OR** ondansetron (ZOFRAN) IV, sodium chloride, sodium chloride flush   Time Spent in minutes  25  See all Orders from today for further details   Oren Binet M.D on 11/22/2019 at 11:08 AM  To page go to [email or web address] - use universal password  [facility] -  Office  [phone]    Objective:   Vitals:   11/21/19 1617 11/21/19 1940 11/22/19 0342 11/22/19 0850  BP: 130/75 (!) 144/82 118/75 106/67  Pulse: 71 64 60 74  Resp: 15 18 16 20   Temp: 97.6 F (36.4 C) 97.8 F (36.6 C) 97.9 F (36.6 C) (!) 97.5 F (36.4 C)  TempSrc: Oral Oral Oral Oral  SpO2: 94% 93% 92% (!) 89%  Weight:      Height:        Wt Readings from Last 3 Encounters:  11/18/19 108.9 kg  05/01/18 108.9 kg  04/17/18 109.5 kg     Intake/Output Summary (Last 24 hours) at 11/22/2019 1108 Last data filed at 11/22/2019 0851 Gross per 24 hour  Intake 1120 ml  Output 450 ml  Net 670 ml     Physical Exam Gen Exam:Alert awake-not in any distress HEENT:atraumatic, normocephalic Chest: B/L clear to auscultation anteriorly CVS:S1S2 regular Abdomen:soft non tender, non distended Extremities:no edema Neurology: Non focal Skin: no rash   Data Review:    CBC Recent Labs  Lab 11/18/19 2043 11/20/19 1521 11/21/19 0145 11/22/19 0700  WBC 5.8  14.3* 14.6* 13.5*  HGB 14.7 13.8 13.8 13.7  HCT 45.2 40.2 41.6 40.7  PLT 198 263 290 299  MCV 91.9 88.9 89.3 89.1  MCH 29.9 30.5 29.6 30.0  MCHC 32.5 34.3 33.2 33.7  RDW 12.9 12.7 12.7 13.0  LYMPHSABS 1.4  --   --   --   MONOABS 0.3  --   --   --   EOSABS 0.0  --   --   --   BASOSABS 0.0  --   --   --     Chemistries  Recent Labs  Lab 11/18/19 2043 11/20/19 1521 11/21/19 0145 11/22/19 0700  NA 138 138 141 141  K 3.5 3.8 3.8 4.4  CL 100 105 103 107  CO2 22 22 24 25    GLUCOSE 100* 199* 121* 140*  BUN 15 25* 30* 32*  CREATININE 1.18 0.99 0.94 0.86  CALCIUM 8.5* 8.7* 8.9 8.7*  AST 58* 59* 67* 58*  ALT 42 53* 68* 76*  ALKPHOS 76 70 73 77  BILITOT 1.1 0.8 0.6 0.7   ------------------------------------------------------------------------------------------------------------------ No results for input(s): CHOL, HDL, LDLCALC, TRIG, CHOLHDL, LDLDIRECT in the last 72 hours.  No results found for: HGBA1C ------------------------------------------------------------------------------------------------------------------ No results for input(s): TSH, T4TOTAL, T3FREE, THYROIDAB in the last 72 hours.  Invalid input(s): FREET3 ------------------------------------------------------------------------------------------------------------------ Recent Labs    11/21/19 0145 11/22/19 0700  FERRITIN 915* 678*    Coagulation profile No results for input(s): INR, PROTIME in the last 168 hours.  Recent Labs    11/21/19 0145 11/22/19 0700  DDIMER 1.68* 1.29*    Cardiac Enzymes No results for input(s): CKMB, TROPONINI, MYOGLOBIN in the last 168 hours.  Invalid input(s): CK ------------------------------------------------------------------------------------------------------------------ No results found for: BNP  Micro Results Recent Results (from the past 240 hour(s))  Respiratory Panel by RT PCR (Flu A&B, Covid) - Nasopharyngeal Swab     Status: None   Collection Time: 11/18/19  9:41 PM   Specimen: Nasopharyngeal Swab  Result Value Ref Range Status   SARS Coronavirus 2 by RT PCR NEGATIVE NEGATIVE Final    Comment: (NOTE) SARS-CoV-2 target nucleic acids are NOT DETECTED. The SARS-CoV-2 RNA is generally detectable in upper respiratoy specimens during the acute phase of infection. The lowest concentration of SARS-CoV-2 viral copies this assay can detect is 131 copies/mL. A negative result does not preclude SARS-Cov-2 infection and should not be used as the  sole basis for treatment or other patient management decisions. A negative result may occur with  improper specimen collection/handling, submission of specimen other than nasopharyngeal swab, presence of viral mutation(s) within the areas targeted by this assay, and inadequate number of viral copies (<131 copies/mL). A negative result must be combined with clinical observations, patient history, and epidemiological information. The expected result is Negative. Fact Sheet for Patients:  PinkCheek.be Fact Sheet for Healthcare Providers:  GravelBags.it This test is not yet ap proved or cleared by the [place] FDA and  has been authorized for detection and/or diagnosis of SARS-CoV-2 by FDA under an Emergency Use Authorization (EUA). This EUA will remain  in effect (meaning this test can be used) for the duration of the COVID-19 declaration under Section 564(b)(1) of the Act, 21 U.S.C. section 360bbb-3(b)(1), unless the authorization is terminated or revoked sooner.    Influenza A by PCR NEGATIVE NEGATIVE Final   Influenza B by PCR NEGATIVE NEGATIVE Final    Comment: (NOTE) The Xpert Xpress SARS-CoV-2/FLU/RSV assay is intended as an aid in  the diagnosis of influenza  from Nasopharyngeal swab specimens and  should not be used as a sole basis for treatment. Nasal washings and  aspirates are unacceptable for Xpert Xpress SARS-CoV-2/FLU/RSV  testing. Fact Sheet for Patients: PinkCheek.be Fact Sheet for Healthcare Providers: GravelBags.it This test is not yet approved or cleared by the [place] FDA and  has been authorized for detection and/or diagnosis of SARS-CoV-2 by  FDA under an Emergency Use Authorization (EUA). This EUA will remain  in effect (meaning this test can be used) for the duration of the  Covid-19 declaration under Section 564(b)(1) of the Act, 21   U.S.C. section 360bbb-3(b)(1), unless the authorization is  terminated or revoked. Performed at [facility], [address] [place], [place] [address]   SARS CORONAVIRUS 2 (TAT 6-24 HRS) Nasopharyngeal Nasopharyngeal Swab     Status: Abnormal   Collection Time: 11/18/19  9:44 PM   Specimen: Nasopharyngeal Swab  Result Value Ref Range Status   SARS Coronavirus 2 POSITIVE (A) NEGATIVE Final    Comment: RESULT CALLED TO, READ BACK BY AND VERIFIED WITH: Okey Regal RN 15:45 11/19/19 ([email or web address]) (NOTE) SARS-CoV-2 target nucleic acids are DETECTED. The SARS-CoV-2 RNA is generally detectable in upper and lower respiratory specimens during the acute phase of infection. Positive results are indicative of the presence of SARS-CoV-2 RNA. Clinical correlation with patient history and other diagnostic information is  necessary to determine patient infection status. Positive results do not rule out bacterial infection or co-infection with other viruses.  The expected result is Negative. Fact Sheet for Patients: SugarRoll.be Fact Sheet for Healthcare Providers: https://www.woods-mathews.com/ This test is not yet approved or cleared by the [place] FDA and  has been authorized for detection and/or diagnosis of SARS-CoV-2 by FDA under an Emergency Use Authorization (EUA). This EUA will remain  in effect (meaning this test can be used) for  the duration of the COVID-19 declaration under Section 564(b)(1) of the Act, 21 U.S.C. section 360bbb-3(b)(1), unless the authorization is terminated or revoked sooner. Performed at [facility], [place] [address], [place], [place] [address]   Blood Culture (routine x 2)     Status: None (Preliminary result)   Collection Time: 11/18/19 10:21 PM   Specimen: BLOOD  Result Value Ref Range Status   Specimen Description BLOOD LEFT ANTECUBITAL  Final   Special Requests   Final    BOTTLES DRAWN AEROBIC AND ANAEROBIC  Blood Culture adequate volume   Culture   Final    NO GROWTH 3 DAYS Performed at [facility], [address], [place], [place] [address]    Report Status PENDING  Incomplete  Blood Culture (routine x 2)     Status: None (Preliminary result)   Collection Time: 11/18/19 10:29 PM   Specimen: BLOOD  Result Value Ref Range Status   Specimen Description BLOOD LEFT ANTECUBITAL  Final   Special Requests   Final    BOTTLES DRAWN AEROBIC AND ANAEROBIC Blood Culture adequate volume   Culture   Final    NO GROWTH 3 DAYS Performed at [facility], [address] [place], [place] [address]    Report Status PENDING  Incomplete    Radiology Reports CT ANGIO CHEST PE W OR WO CONTRAST  Result Date: 11/19/2019 CLINICAL DATA:  Worsening shortness of breath 4 days. COVID-19 positive 10 days. Negative COVID-19 test today. Positive D-dimer. EXAM: CT ANGIOGRAPHY CHEST WITH CONTRAST TECHNIQUE: Multidetector CT imaging of the chest was performed using the standard protocol during bolus administration of intravenous contrast.  Multiplanar CT image reconstructions and MIPs were obtained to evaluate the vascular anatomy. CONTRAST:  128mL OMNIPAQUE IOHEXOL 350 MG/ML SOLN COMPARISON:  None. FINDINGS: Cardiovascular: Median sternotomy wires are present. Mild cardiomegaly. Calcified plaque over the coronary arteries. Thoracic aorta is normal in caliber without aneurysm or dissection. Minimal calcified plaque over the aortic arch. Pulmonary arterial system is well opacified without evidence of emboli. Mediastinum/Nodes: 1 cm precarinal lymph node is present. There is a 1.9 cm right subcarinal lymph node no significant hilar adenopathy. Remaining mediastinal structures are unremarkable. Lungs/Pleura: Lungs are adequately inflated and demonstrate a patchy bilateral peripheral predominant airspace process worse over the mid to lower lungs. Findings likely due to multifocal pneumonia. No evidence of effusion 1 cm subpleural  granuloma over the posteromedial right lower lobe. Airways are normal. Upper Abdomen: No acute findings. Musculoskeletal: Degenerative change of the spine. Review of the MIP images confirms the above findings. IMPRESSION: 1.  No evidence of pulmonary embolism. 2. Patchy bilateral airspace process likely multifocal pneumonia. Mild associated mediastinal adenopathy likely reactive. 3. Aortic Atherosclerosis (ICD10-I70.0). Atherosclerotic coronary artery disease. Electronically Signed   By: Marin Olp M.D.   On: 11/19/2019 11:27   DG Chest Portable 1 View  Result Date: 11/18/2019 CLINICAL DATA:  Shortness of breath and COVID-19 positivity for several days EXAM: PORTABLE CHEST 1 VIEW COMPARISON:  None. FINDINGS: Cardiac shadow is mildly prominent. Postsurgical changes are seen. Patchy airspace opacities are noted left greater than right likely related to the underlying clinical history. No sizable effusion is seen. No bony abnormality is noted. IMPRESSION: Patchy airspace opacities consistent with the given clinical history. Electronically Signed   By: Inez Catalina M.D.   On: 11/18/2019 21:34

## 2019-11-22 NOTE — Progress Notes (Signed)
RN spoke with pt's wife. Update given. No further concerns or questions.

## 2019-11-23 LAB — PREPARE FRESH FROZEN PLASMA: Unit division: 0

## 2019-11-23 LAB — CBC
HCT: 42.5 % (ref 39.0–52.0)
Hemoglobin: 14.1 g/dL (ref 13.0–17.0)
MCH: 29.8 pg (ref 26.0–34.0)
MCHC: 33.2 g/dL (ref 30.0–36.0)
MCV: 89.9 fL (ref 80.0–100.0)
Platelets: 326 10*3/uL (ref 150–400)
RBC: 4.73 MIL/uL (ref 4.22–5.81)
RDW: 12.9 % (ref 11.5–15.5)
WBC: 12.9 10*3/uL — ABNORMAL HIGH (ref 4.0–10.5)
nRBC: 0 % (ref 0.0–0.2)

## 2019-11-23 LAB — COMPREHENSIVE METABOLIC PANEL
ALT: 100 U/L — ABNORMAL HIGH (ref 0–44)
AST: 67 U/L — ABNORMAL HIGH (ref 15–41)
Albumin: 3 g/dL — ABNORMAL LOW (ref 3.5–5.0)
Alkaline Phosphatase: 74 U/L (ref 38–126)
Anion gap: 11 (ref 5–15)
BUN: 36 mg/dL — ABNORMAL HIGH (ref 8–23)
CO2: 27 mmol/L (ref 22–32)
Calcium: 8.7 mg/dL — ABNORMAL LOW (ref 8.9–10.3)
Chloride: 102 mmol/L (ref 98–111)
Creatinine, Ser: 0.91 mg/dL (ref 0.61–1.24)
GFR calc Af Amer: >60 mL/min (ref >60)
GFR calc non Af Amer: >60 mL/min (ref >60)
Glucose, Bld: 128 mg/dL — ABNORMAL HIGH (ref 70–99)
Potassium: 4.1 mmol/L (ref 3.5–5.1)
Sodium: 140 mmol/L (ref 135–145)
Total Bilirubin: 0.8 mg/dL (ref 0.3–1.2)
Total Protein: 6.7 g/dL (ref 6.5–8.1)

## 2019-11-23 LAB — CULTURE, BLOOD (ROUTINE X 2)
Culture: NO GROWTH
Culture: NO GROWTH
Special Requests: ADEQUATE
Special Requests: ADEQUATE

## 2019-11-23 LAB — D-DIMER, QUANTITATIVE: D-Dimer, Quant: 1.2 ug/mL-FEU — ABNORMAL HIGH (ref 0.00–0.50)

## 2019-11-23 LAB — C-REACTIVE PROTEIN: CRP: 1.5 mg/dL — ABNORMAL HIGH (ref <1.0)

## 2019-11-23 LAB — HEMOGLOBIN A1C
Hgb A1c MFr Bld: 6.6 % — ABNORMAL HIGH (ref 4.8–5.6)
Mean Plasma Glucose: 142.72 mg/dL

## 2019-11-23 LAB — BPAM FFP
Blood Product Expiration Date: 202012122250
ISSUE DATE / TIME: 202012120132
Unit Type and Rh: 5100

## 2019-11-23 LAB — GLUCOSE, CAPILLARY
Glucose-Capillary: 169 mg/dL — ABNORMAL HIGH (ref 70–99)
Glucose-Capillary: 196 mg/dL — ABNORMAL HIGH (ref 70–99)
Glucose-Capillary: 139 mg/dL — ABNORMAL HIGH (ref 70–99)
Glucose-Capillary: 244 mg/dL — ABNORMAL HIGH (ref 70–99)
Glucose-Capillary: 108 mg/dL — ABNORMAL HIGH (ref 70–99)
Glucose-Capillary: 179 mg/dL — ABNORMAL HIGH (ref 70–99)

## 2019-11-23 LAB — FERRITIN: Ferritin: 681 ng/mL — ABNORMAL HIGH (ref 24–336)

## 2019-11-23 MED ORDER — METHYLPREDNISOLONE SODIUM SUCC 40 MG IJ SOLR
40.0000 mg | Freq: Two times a day (BID) | INTRAMUSCULAR | Status: DC
  Administered 2019-11-23 – 2019-11-24 (×2): 40 mg via INTRAVENOUS
  Filled 2019-11-23 (×2): qty 1

## 2019-11-23 NOTE — Progress Notes (Signed)
PROGRESS NOTE                                                                                                                                                                                                             Patient Demographics:    Thomas Hood, is a 63 y.o. male, DOB - March 12, 1956, [id number]  Outpatient Primary MD for the patient is Thomas Athens, MD   Admit date - 11/18/2019   LOS - 4  Chief Complaint  Patient presents with  . Shortness of Breath       Brief Narrative: Patient is a 63 y.o. male with PMHx of CAD s/p CABG, HTN-who presented with worsening shortness of breath, he was found to have acute hypoxic respiratory failure in the setting of COVID-19 pneumonia.   Subjective:    Thomas Hood feels fine at rest-but desaturates with ambulation.   Assessment  & Plan :   Acute Hypoxic Resp Failure due to Covid 19 Viral pneumonia: Significantly improved-does not require oxygen at rest, but continues to require oxygen with ambulation.  Will complete remdesivir on 12/15-continue steroids-but taper Solu-Medrol to 40 mg twice daily..  Given the fact that he still requires oxygen with ambulation-suspect he still needs inpatient stay for another day or so to see if we can get him off oxygen.  Will complete remdesivir on 12/15.    Assess for home O2 requirement prior to discharge.   Fever: afebrile  O2 requirements:  SpO2: 90 % O2 Flow Rate (L/min): 2 L/min   COVID-19 Labs: Recent Labs    11/21/19 0145 11/22/19 0700 11/23/19 0520  DDIMER 1.68* 1.29* 1.20*  FERRITIN 915* 678* 681*  CRP 6.7* 2.7* 1.5*    No results found for: BNP  Recent Labs  Lab 11/18/19 2115  PROCALCITON <0.10    Lab Results  Component Value Date   SARSCOV2NAA POSITIVE (A) 11/18/2019   [place] NEGATIVE 11/18/2019     COVID-19 Medications: Steroids: 12/9>> Remdesivir: 12/11>> Actemra: 12/11 x 1 Convalescent  Plasma: 12/11 x 1  Other medications: Diuretics:Euvolemic-continue Lasix 40 mg IV daily to maintain negative balance Antibiotics:Not needed as no evidence of bacterial infection Insulin: CBG stable on SSI-monitor closely while on steroids.  A1c 6.6  Prone/Incentive Spirometry: encouraged incentive spirometry use 3-4/hour.  DVT Prophylaxis  :  Lovenox   CAD s/p CABG: No  anginal symptoms-continue aspirin, beta-blocker and statin.  HTN: Controlled-continue Lopressor  Obesity: Estimated body mass index is 39.94 kg/m as calculated from the following:   Height as of this encounter: 5\' 5"  (1.651 m).   Weight as of this encounter: 108.9 kg.    Consults  :  None  Procedures  :  None  ABG: No results found for: PHART, PCO2ART, PO2ART, HCO3, TCO2, ACIDBASEDEF, O2SAT  Vent Settings: N/A  Condition - Stable  Family Communication  :  Spouse updated over the phone on 12/14  Code Status :  Full Code  Diet :  Diet Order            Diet regular Room service appropriate? Yes; Fluid consistency: Thin  Diet effective now               Disposition Plan  :  Remain hospitalized  Barriers to discharge: Complete 5 days of IV Remdesivir  Antimicorbials  :    Anti-infectives (From admission, onward)   Start     Dose/Rate Route Frequency Ordered Stop   11/21/19 1000  remdesivir 100 mg in sodium chloride 0.9 % 100 mL IVPB     100 mg 200 mL/hr over 30 Minutes Intravenous Daily 11/20/19 0752 11/25/19 0959   11/20/19 0900  remdesivir 200 mg in sodium chloride 0.9% 250 mL IVPB     200 mg 580 mL/hr over 30 Minutes Intravenous Once 11/20/19 0752 11/20/19 1032      Inpatient Medications  Scheduled Meds: . aspirin EC  81 mg Oral Daily  . atorvastatin  40 mg Oral QPM  . enoxaparin (LOVENOX) injection  55 mg Subcutaneous Q24H  . furosemide  40 mg Intravenous Daily  . insulin aspart  0-9 Units Subcutaneous TID WC  . loratadine  10 mg Oral QPM  . methylPREDNISolone (SOLU-MEDROL)  injection  60 mg Intravenous Q12H  . metoprolol tartrate  25 mg Oral Q12H  . sodium chloride flush  3 mL Intravenous Q12H  . vitamin C  500 mg Oral Daily  . zinc sulfate  220 mg Oral Daily   Continuous Infusions: . sodium chloride 250 mL (11/20/19 1002)  . remdesivir 100 mg in NS 100 mL 100 mg (11/23/19 0904)   PRN Meds:.sodium chloride, acetaminophen, ondansetron **OR** ondansetron (ZOFRAN) IV, sodium chloride, sodium chloride flush   Time Spent in minutes  25  See all Orders from today for further details   Oren Binet M.D on 11/23/2019 at 10:28 AM  To page go to [email or web address] - use universal password  [facility] -  Office  [phone]    Objective:   Vitals:   11/22/19 1615 11/22/19 2000 11/23/19 0326 11/23/19 0736  BP: (!) 146/86 132/90 119/80 128/82  Pulse: 63 84 63 (!) 56  Resp: 16 18 18 18   Temp: 97.7 F (36.5 C) (!) 97.5 F (36.4 C) 97.7 F (36.5 C) 97.6 F (36.4 C)  TempSrc: Oral Oral Oral Oral  SpO2: 94% 96% 94% 90%  Weight:      Height:        Wt Readings from Last 3 Encounters:  11/18/19 108.9 kg  05/01/18 108.9 kg  04/17/18 109.5 kg     Intake/Output Summary (Last 24 hours) at 11/23/2019 1028 Last data filed at 11/22/2019 2000 Gross per 24 hour  Intake 760 ml  Output --  Net 760 ml     Physical Exam Gen Exam:Alert awake-not in any distress HEENT:atraumatic, normocephalic Chest: B/L clear to auscultation anteriorly CVS:S1S2 regular Abdomen:soft  non tender, non distended Extremities:no edema Neurology: Non focal Skin: no rash   Data Review:    CBC Recent Labs  Lab 11/18/19 2043 11/20/19 1521 11/21/19 0145 11/22/19 0700 11/23/19 0520  WBC 5.8 14.3* 14.6* 13.5* 12.9*  HGB 14.7 13.8 13.8 13.7 14.1  HCT 45.2 40.2 41.6 40.7 42.5  PLT 198 263 290 299 326  MCV 91.9 88.9 89.3 89.1 89.9  MCH 29.9 30.5 29.6 30.0 29.8  MCHC 32.5 34.3 33.2 33.7 33.2  RDW 12.9 12.7 12.7 13.0 12.9  LYMPHSABS 1.4  --   --   --   --    MONOABS 0.3  --   --   --   --   EOSABS 0.0  --   --   --   --   BASOSABS 0.0  --   --   --   --     Chemistries  Recent Labs  Lab 11/18/19 2043 11/20/19 1521 11/21/19 0145 11/22/19 0700 11/23/19 0520  NA 138 138 141 141 140  K 3.5 3.8 3.8 4.4 4.1  CL 100 105 103 107 102  CO2 22 22 24 25 27   GLUCOSE 100* 199* 121* 140* 128*  BUN 15 25* 30* 32* 36*  CREATININE 1.18 0.99 0.94 0.86 0.91  CALCIUM 8.5* 8.7* 8.9 8.7* 8.7*  AST 58* 59* 67* 58* 67*  ALT 42 53* 68* 76* 100*  ALKPHOS 76 70 73 77 74  BILITOT 1.1 0.8 0.6 0.7 0.8   ------------------------------------------------------------------------------------------------------------------ No results for input(s): CHOL, HDL, LDLCALC, TRIG, CHOLHDL, LDLDIRECT in the last 72 hours.  Lab Results  Component Value Date   HGBA1C 6.6 (H) 11/22/2019   ------------------------------------------------------------------------------------------------------------------ No results for input(s): TSH, T4TOTAL, T3FREE, THYROIDAB in the last 72 hours.  Invalid input(s): FREET3 ------------------------------------------------------------------------------------------------------------------ Recent Labs    11/22/19 0700 11/23/19 0520  FERRITIN 678* 681*    Coagulation profile No results for input(s): INR, PROTIME in the last 168 hours.  Recent Labs    11/22/19 0700 11/23/19 0520  DDIMER 1.29* 1.20*    Cardiac Enzymes No results for input(s): CKMB, TROPONINI, MYOGLOBIN in the last 168 hours.  Invalid input(s): CK ------------------------------------------------------------------------------------------------------------------ No results found for: BNP  Micro Results Recent Results (from the past 240 hour(s))  Respiratory Panel by RT PCR (Flu A&B, Covid) - Nasopharyngeal Swab     Status: None   Collection Time: 11/18/19  9:41 PM   Specimen: Nasopharyngeal Swab  Result Value Ref Range Status   SARS Coronavirus 2 by RT PCR  NEGATIVE NEGATIVE Final    Comment: (NOTE) SARS-CoV-2 target nucleic acids are NOT DETECTED. The SARS-CoV-2 RNA is generally detectable in upper respiratoy specimens during the acute phase of infection. The lowest concentration of SARS-CoV-2 viral copies this assay can detect is 131 copies/mL. A negative result does not preclude SARS-Cov-2 infection and should not be used as the sole basis for treatment or other patient management decisions. A negative result may occur with  improper specimen collection/handling, submission of specimen other than nasopharyngeal swab, presence of viral mutation(s) within the areas targeted by this assay, and inadequate number of viral copies (<131 copies/mL). A negative result must be combined with clinical observations, patient history, and epidemiological information. The expected result is Negative. Fact Sheet for Patients:  PinkCheek.be Fact Sheet for Healthcare Providers:  GravelBags.it This test is not yet ap proved or cleared by the [place] FDA and  has been authorized for detection and/or diagnosis of SARS-CoV-2 by FDA under an Emergency Use  Authorization (EUA). This EUA will remain  in effect (meaning this test can be used) for the duration of the COVID-19 declaration under Section 564(b)(1) of the Act, 21 U.S.C. section 360bbb-3(b)(1), unless the authorization is terminated or revoked sooner.    Influenza A by PCR NEGATIVE NEGATIVE Final   Influenza B by PCR NEGATIVE NEGATIVE Final    Comment: (NOTE) The Xpert Xpress SARS-CoV-2/FLU/RSV assay is intended as an aid in  the diagnosis of influenza from Nasopharyngeal swab specimens and  should not be used as a sole basis for treatment. Nasal washings and  aspirates are unacceptable for Xpert Xpress SARS-CoV-2/FLU/RSV  testing. Fact Sheet for Patients: PinkCheek.be Fact Sheet for Healthcare  Providers: GravelBags.it This test is not yet approved or cleared by the [place] FDA and  has been authorized for detection and/or diagnosis of SARS-CoV-2 by  FDA under an Emergency Use Authorization (EUA). This EUA will remain  in effect (meaning this test can be used) for the duration of the  Covid-19 declaration under Section 564(b)(1) of the Act, 21  U.S.C. section 360bbb-3(b)(1), unless the authorization is  terminated or revoked. Performed at [facility], [address] [place], [place] [address]   SARS CORONAVIRUS 2 (TAT 6-24 HRS) Nasopharyngeal Nasopharyngeal Swab     Status: Abnormal   Collection Time: 11/18/19  9:44 PM   Specimen: Nasopharyngeal Swab  Result Value Ref Range Status   SARS Coronavirus 2 POSITIVE (A) NEGATIVE Final    Comment: RESULT CALLED TO, READ BACK BY AND VERIFIED WITH: Okey Regal RN 15:45 11/19/19 ([email or web address]) (NOTE) SARS-CoV-2 target nucleic acids are DETECTED. The SARS-CoV-2 RNA is generally detectable in upper and lower respiratory specimens during the acute phase of infection. Positive results are indicative of the presence of SARS-CoV-2 RNA. Clinical correlation with patient history and other diagnostic information is  necessary to determine patient infection status. Positive results do not rule out bacterial infection or co-infection with other viruses.  The expected result is Negative. Fact Sheet for Patients: SugarRoll.be Fact Sheet for Healthcare Providers: https://www.woods-mathews.com/ This test is not yet approved or cleared by the [place] FDA and  has been authorized for detection and/or diagnosis of SARS-CoV-2 by FDA under an Emergency Use Authorization (EUA). This EUA will remain  in effect (meaning this test can be used) for  the duration of the COVID-19 declaration under Section 564(b)(1) of the Act, 21 U.S.C. section 360bbb-3(b)(1), unless the  authorization is terminated or revoked sooner. Performed at [facility], [place] [address], [place], [place] [address]   Blood Culture (routine x 2)     Status: None   Collection Time: 11/18/19 10:21 PM   Specimen: BLOOD  Result Value Ref Range Status   Specimen Description BLOOD LEFT ANTECUBITAL  Final   Special Requests   Final    BOTTLES DRAWN AEROBIC AND ANAEROBIC Blood Culture adequate volume   Culture   Final    NO GROWTH 5 DAYS Performed at [facility], [address] [place], [place] [address]    Report Status 11/23/2019 FINAL  Final  Blood Culture (routine x 2)     Status: None   Collection Time: 11/18/19 10:29 PM   Specimen: BLOOD  Result Value Ref Range Status   Specimen Description BLOOD LEFT ANTECUBITAL  Final   Special Requests   Final    BOTTLES DRAWN AEROBIC AND ANAEROBIC Blood Culture adequate volume   Culture   Final    NO GROWTH 5 DAYS Performed at [facility], [address] [place], [place] [address]    Report Status 11/23/2019 FINAL  Final    Radiology Reports CT ANGIO CHEST PE W OR WO CONTRAST  Result Date: 11/19/2019 CLINICAL DATA:  Worsening shortness of breath 4 days. COVID-19 positive 10 days. Negative COVID-19 test today. Positive D-dimer. EXAM: CT ANGIOGRAPHY CHEST WITH CONTRAST TECHNIQUE: Multidetector CT imaging of the chest was performed using the standard protocol during bolus administration of intravenous contrast. Multiplanar CT image reconstructions and MIPs were obtained to evaluate the vascular anatomy. CONTRAST:  174mL OMNIPAQUE IOHEXOL 350 MG/ML SOLN COMPARISON:  None. FINDINGS: Cardiovascular: Median sternotomy wires are present. Mild cardiomegaly. Calcified plaque over the coronary arteries. Thoracic aorta is normal in caliber without aneurysm or dissection. Minimal calcified plaque over the aortic arch. Pulmonary arterial system is well opacified without evidence of emboli. Mediastinum/Nodes: 1 cm precarinal lymph node is present.  There is a 1.9 cm right subcarinal lymph node no significant hilar adenopathy. Remaining mediastinal structures are unremarkable. Lungs/Pleura: Lungs are adequately inflated and demonstrate a patchy bilateral peripheral predominant airspace process worse over the mid to lower lungs. Findings likely due to multifocal pneumonia. No evidence of effusion 1 cm subpleural granuloma over the posteromedial right lower lobe. Airways are normal. Upper Abdomen: No acute findings. Musculoskeletal: Degenerative change of the spine. Review of the MIP images confirms the above findings. IMPRESSION: 1.  No evidence of pulmonary embolism. 2. Patchy bilateral airspace process likely multifocal pneumonia. Mild associated mediastinal adenopathy likely reactive. 3. Aortic Atherosclerosis (ICD10-I70.0). Atherosclerotic coronary artery disease. Electronically Signed   By: Marin Olp M.D.   On: 11/19/2019 11:27   DG Chest Portable 1 View  Result Date: 11/18/2019 CLINICAL DATA:  Shortness of breath and COVID-19 positivity for several days EXAM: PORTABLE CHEST 1 VIEW COMPARISON:  None. FINDINGS: Cardiac shadow is mildly prominent. Postsurgical changes are seen. Patchy airspace opacities are noted left greater than right likely related to the underlying clinical history. No sizable effusion is seen. No bony abnormality is noted. IMPRESSION: Patchy airspace opacities consistent with the given clinical history. Electronically Signed   By: Inez Catalina M.D.   On: 11/18/2019 21:34

## 2019-11-23 NOTE — Progress Notes (Signed)
IV access no longer patent. Solumedrol was not given. Pt refuses another IV due to d/c on today.

## 2019-11-23 NOTE — Plan of Care (Signed)
  Problem: Education: Goal: Knowledge of General Education information will improve Description: Including pain rating scale, medication(s)/side effects and non-pharmacologic comfort measures Outcome: Progressing   Problem: Clinical Measurements: Goal: Ability to maintain clinical measurements within normal limits will improve Outcome: Progressing Goal: Will remain free from infection Outcome: Progressing Goal: Diagnostic test results will improve Outcome: Progressing Goal: Respiratory complications will improve Outcome: Progressing Goal: Cardiovascular complication will be avoided Outcome: Progressing   Problem: Activity: Goal: Risk for activity intolerance will decrease Description: On 5 liters O2  Outcome: Progressing   Problem: Nutrition: Goal: Adequate nutrition will be maintained Outcome: Progressing   Problem: Coping: Goal: Level of anxiety will decrease Outcome: Progressing   Problem: Elimination: Goal: Will not experience complications related to bowel motility Outcome: Progressing Goal: Will not experience complications related to urinary retention Outcome: Progressing   Problem: Pain Managment: Goal: General experience of comfort will improve Outcome: Progressing   Problem: Safety: Goal: Ability to remain free from injury will improve Outcome: Progressing   Problem: Skin Integrity: Goal: Risk for impaired skin integrity will decrease Outcome: Progressing

## 2019-11-23 NOTE — Progress Notes (Signed)
Pt. Will be discharged tomorrow morning after redemsevir and home O2 set up. MD asked to have pt. Ambulate tonight to make sure he still requires home O2.

## 2019-11-24 LAB — COMPREHENSIVE METABOLIC PANEL
ALT: 118 U/L — ABNORMAL HIGH (ref 0–44)
AST: 74 U/L — ABNORMAL HIGH (ref 15–41)
Albumin: 2.9 g/dL — ABNORMAL LOW (ref 3.5–5.0)
Alkaline Phosphatase: 64 U/L (ref 38–126)
Anion gap: 11 (ref 5–15)
BUN: 35 mg/dL — ABNORMAL HIGH (ref 8–23)
CO2: 24 mmol/L (ref 22–32)
Calcium: 8.4 mg/dL — ABNORMAL LOW (ref 8.9–10.3)
Chloride: 103 mmol/L (ref 98–111)
Creatinine, Ser: 0.91 mg/dL (ref 0.61–1.24)
GFR calc Af Amer: >60 mL/min (ref >60)
GFR calc non Af Amer: >60 mL/min (ref >60)
Glucose, Bld: 150 mg/dL — ABNORMAL HIGH (ref 70–99)
Potassium: 4.4 mmol/L (ref 3.5–5.1)
Sodium: 138 mmol/L (ref 135–145)
Total Bilirubin: 0.9 mg/dL (ref 0.3–1.2)
Total Protein: 6.3 g/dL — ABNORMAL LOW (ref 6.5–8.1)

## 2019-11-24 LAB — CBC
HCT: 42.5 % (ref 39.0–52.0)
Hemoglobin: 14 g/dL (ref 13.0–17.0)
MCH: 29.5 pg (ref 26.0–34.0)
MCHC: 32.9 g/dL (ref 30.0–36.0)
MCV: 89.5 fL (ref 80.0–100.0)
Platelets: 323 10*3/uL (ref 150–400)
RBC: 4.75 MIL/uL (ref 4.22–5.81)
RDW: 13 % (ref 11.5–15.5)
WBC: 11.1 10*3/uL — ABNORMAL HIGH (ref 4.0–10.5)
nRBC: 0.3 % — ABNORMAL HIGH (ref 0.0–0.2)

## 2019-11-24 LAB — FERRITIN: Ferritin: 655 ng/mL — ABNORMAL HIGH (ref 24–336)

## 2019-11-24 LAB — GLUCOSE, CAPILLARY
Glucose-Capillary: 130 mg/dL — ABNORMAL HIGH (ref 70–99)
Glucose-Capillary: 224 mg/dL — ABNORMAL HIGH (ref 70–99)

## 2019-11-24 LAB — D-DIMER, QUANTITATIVE: D-Dimer, Quant: 1.15 ug/mL-FEU — ABNORMAL HIGH (ref 0.00–0.50)

## 2019-11-24 LAB — C-REACTIVE PROTEIN: CRP: <0.5 mg/dL (ref <1.0)

## 2019-11-24 MED ORDER — PREDNISONE 10 MG PO TABS: ORAL_TABLET | ORAL | 0 refills | Status: DC

## 2019-11-24 NOTE — Discharge Instructions (Signed)
Person Under Monitoring Name: Thomas Hood  Location: [id number] [place] [address]   Infection Prevention Recommendations for Individuals Confirmed to have, or Being Evaluated for, 2019 Novel Coronavirus (COVID-19) Infection Who Receive Care at Home  Individuals who are confirmed to have, or are being evaluated for, COVID-19 should follow the prevention steps below until a healthcare provider or local or state health department says they can return to normal activities.  Stay home except to get medical care You should restrict activities outside your home, except for getting medical care. Do not go to work, school, or public areas, and do not use public transportation or taxis.  Call ahead before visiting your doctor Before your medical appointment, call the healthcare provider and tell them that you have, or are being evaluated for, COVID-19 infection. This will help the healthcare provider's office take steps to keep other people from getting infected. Ask your healthcare provider to call the local or state health department.  Monitor your symptoms Seek prompt medical attention if your illness is worsening (e.g., difficulty breathing). Before going to your medical appointment, call the healthcare provider and tell them that you have, or are being evaluated for, COVID-19 infection. Ask your healthcare provider to call the local or state health department.  Wear a facemask You should wear a facemask that covers your nose and mouth when you are in the same room with other people and when you visit a healthcare provider. People who live with or visit you should also wear a facemask while they are in the same room with you.  Separate yourself from other people in your home As much as possible, you should stay in a different room from other people in your home. Also, you should use a separate bathroom, if available.  Avoid sharing household items You  should not share dishes, drinking glasses, cups, eating utensils, towels, bedding, or other items with other people in your home. After using these items, you should wash them thoroughly with soap and water.  Cover your coughs and sneezes Cover your mouth and nose with a tissue when you cough or sneeze, or you can cough or sneeze into your sleeve. Throw used tissues in a lined trash can, and immediately wash your hands with soap and water for at least 20 seconds or use an alcohol-based hand rub.  Wash your [facility] your hands often and thoroughly with soap and water for at least 20 seconds. You can use an alcohol-based hand sanitizer if soap and water are not available and if your hands are not visibly dirty. Avoid touching your eyes, nose, and mouth with unwashed hands.   Prevention Steps for Caregivers and Household Members of Individuals Confirmed to have, or Being Evaluated for, COVID-19 Infection Being Cared for in the Home  If you live with, or provide care at home for, a person confirmed to have, or being evaluated for, COVID-19 infection please follow these guidelines to prevent infection:  Follow healthcare provider's instructions Make sure that you understand and can help the patient follow any healthcare provider instructions for all care.  Provide for the patient's basic needs You should help the patient with basic needs in the home and provide support for getting groceries, prescriptions, and other personal needs.  Monitor the patient's symptoms If they are getting sicker, call his or her medical provider and tell them that the patient has, or is being evaluated for, COVID-19 infection. This will help the  healthcare provider's office take steps to keep other people from getting infected. Ask the healthcare provider to call the local or state health department.  Limit the number of people who have contact with the patient  If possible, have only one caregiver for the  patient.  Other household members should stay in another home or place of residence. If this is not possible, they should stay  in another room, or be separated from the patient as much as possible. Use a separate bathroom, if available.  Restrict visitors who do not have an essential need to be in the home.  Keep older adults, very young children, and other sick people away from the patient Keep older adults, very young children, and those who have compromised immune systems or chronic health conditions away from the patient. This includes people with chronic heart, lung, or kidney conditions, diabetes, and cancer.  Ensure good ventilation Make sure that shared spaces in the home have good air flow, such as from an air conditioner or an opened window, weather permitting.  Wash your hands often  Wash your hands often and thoroughly with soap and water for at least 20 seconds. You can use an alcohol based hand sanitizer if soap and water are not available and if your hands are not visibly dirty.  Avoid touching your eyes, nose, and mouth with unwashed hands.  Use disposable paper towels to dry your hands. If not available, use dedicated cloth towels and replace them when they become wet.  Wear a facemask and gloves  Wear a disposable facemask at all times in the room and gloves when you touch or have contact with the patient's blood, body fluids, and/or secretions or excretions, such as sweat, saliva, sputum, nasal mucus, vomit, urine, or feces.  Ensure the mask fits over your nose and mouth tightly, and do not touch it during use.  Throw out disposable facemasks and gloves after using them. Do not reuse.  Wash your hands immediately after removing your facemask and gloves.  If your personal clothing becomes contaminated, carefully remove clothing and launder. Wash your hands after handling contaminated clothing.  Place all used disposable facemasks, gloves, and other waste in a lined  container before disposing them with other household waste.  Remove gloves and wash your hands immediately after handling these items.  Do not share dishes, glasses, or other household items with the patient  Avoid sharing household items. You should not share dishes, drinking glasses, cups, eating utensils, towels, bedding, or other items with a patient who is confirmed to have, or being evaluated for, COVID-19 infection.  After the person uses these items, you should wash them thoroughly with soap and water.  Wash laundry thoroughly  Immediately remove and wash clothes or bedding that have blood, body fluids, and/or secretions or excretions, such as sweat, saliva, sputum, nasal mucus, vomit, urine, or feces, on them.  Wear gloves when handling laundry from the patient.  Read and follow directions on labels of laundry or clothing items and detergent. In general, wash and dry with the warmest temperatures recommended on the label.  Clean all areas the individual has used often  Clean all touchable surfaces, such as counters, tabletops, doorknobs, bathroom fixtures, toilets, phones, keyboards, tablets, and bedside tables, every day. Also, clean any surfaces that may have blood, body fluids, and/or secretions or excretions on them.  Wear gloves when cleaning surfaces the patient has come in contact with.  Use a diluted bleach solution (e.g., dilute bleach  with 1 part bleach and 10 parts water) or a household disinfectant with a label that says EPA-registered for coronaviruses. To make a bleach solution at home, add 1 tablespoon of bleach to 1 quart (4 cups) of water. For a larger supply, add  cup of bleach to 1 gallon (16 cups) of water.  Read labels of cleaning products and follow recommendations provided on product labels. Labels contain instructions for safe and effective use of the cleaning product including precautions you should take when applying the product, such as wearing gloves or  eye protection and making sure you have good ventilation during use of the product.  Remove gloves and wash hands immediately after cleaning.  Monitor yourself for signs and symptoms of illness Caregivers and household members are considered close contacts, should monitor their health, and will be asked to limit movement outside of the home to the extent possible. Follow the monitoring steps for close contacts listed on the symptom monitoring form.   ? If you have additional questions, contact your local health department or call the epidemiologist on call at [phone] (available 24/7). ? This guidance is subject to change. For the most up-to-date guidance from [facility], please refer to their website: [email or web address]

## 2019-11-24 NOTE — TOC Transition Note (Signed)
Transition of Care [facility]) - CM/SW Discharge Note   Patient Details  Name: JONITHAN MEDEARIS MRN: [id number] Date of Birth: [birth year]  Transition [facility] Care [facility]) CM/SW Contact:  Amador Cunas, [place] Phone Number: 11/24/2019, 1:32 PM   Clinical Narrative:   Pt for dc home with wife via [facility]. Home oxygen arranged with [employer] and confirmed with wife it has been delivered to home. SW signing off at d/c.  Wandra Feinstein, MSW, LCSW [phone] (GV coverage)       Final next level of care: Home/Self Care Barriers to Discharge: No Barriers Identified   Patient Goals and CMS Choice        Discharge Placement                Patient to be transferred to facility by: [place] Name of family member notified: [place] Patient and family notified of of transfer: 11/24/19  Discharge Plan and Services                DME Arranged: Oxygen DME Agency: [place] Date DME Agency Contacted: 11/24/19 Time DME Agency Contacted: 17 Representative spoke with at DME Agency: [place] ([place]) Interventions     Readmission Risk Interventions No flowsheet data found.

## 2019-11-24 NOTE — Discharge Summary (Addendum)
PATIENT DETAILS Name: Thomas Hood Age: 63 y.o. Sex: male Date of Birth: 19-Aug-1956 MRN: [id number]. Admitting Physician: Oswald Hillock, MD [id number], Viann Shove, MD  Admit Date: 11/18/2019 Discharge date: 11/24/2019  Recommendations for Outpatient Follow-up:  1. Follow up with PCP in 1-2 weeks 2. Please obtain CMP/CBC in one week 3. Repeat Chest Xray in 4-6 week 4. A1c is 6.6-please monitor closely in the outpatient setting-if diet and exercise do not work-may need to be started on diabetic regimen.  Admitted From:  Home  Disposition: [place]: Yes  Equipment/Devices: None  Discharge Condition: Stable  CODE STATUS: FULL CODE  Diet recommendation:  Diet Order            Diet - low sodium heart healthy        Diet regular Room service appropriate? Yes; Fluid consistency: Thin  Diet effective now               Brief Summary: See H&P, Labs, Consult and Test reports for all details in brief, Patient is a 63 y.o. male with PMHx of CAD s/p CABG, HTN-who presented with worsening shortness of breath, he was found to have acute hypoxic respiratory failure in the setting of COVID-19 pneumonia.  Brief Hospital Course: Acute Hypoxic Resp Failure due to Covid 19 Viral pneumonia: Significantly improved-does not require oxygen at rest, but continues to require oxygen with ambulation.  Will complete remdesivir on 12/15-since clinically improved-stable for discharge today.  Will complete a few days of steroid taper.    COVID-19 Labs:  Recent Labs    11/22/19 0700 11/23/19 0520 11/24/19 0525 11/24/19 0526  DDIMER 1.29* 1.20*  --  1.15*  FERRITIN 678* 681* 655*  --   CRP 2.7* 1.5* <0.5  --     Lab Results  Component Value Date   SARSCOV2NAA POSITIVE (A) 11/18/2019   Bromide NEGATIVE 11/18/2019    COVID-19 Medications: Steroids: 12/9>> Remdesivir: 12/11>>12/15 Actemra: 12/11 x 1 Convalescent Plasma: 12/11 x 1  CAD s/p CABG: No anginal  symptoms-continue aspirin, beta-blocker and statin.  HTN: Controlled-continue Lopressor    Obesity: Estimated body mass index is 39.94 kg/m as calculated from the following:   Height as of this encounter: 5\' 5"  (1.651 m).   Weight as of this encounter: 108.9 kg.    Procedures/Studies: None  Discharge Diagnoses:  Active Problems:   Viral pneumonia   Acute respiratory disease due to COVID-19 virus   Discharge Instructions:    Person Under Monitoring Name: Thomas Hood  Location: [address] [place] [address]   Infection Prevention Recommendations for Individuals Confirmed to have, or Being Evaluated for, 2019 Novel Coronavirus (COVID-19) Infection Who Receive Care at Home  Individuals who are confirmed to have, or are being evaluated for, COVID-19 should follow the prevention steps below until a healthcare provider or local or state health department says they can return to normal activities.  Stay home except to get medical care You should restrict activities outside your home, except for getting medical care. Do not go to work, school, or public areas, and do not use public transportation or taxis.  Call ahead before visiting your doctor Before your medical appointment, call the healthcare provider and tell them that you have, or are being evaluated for, COVID-19 infection. This will help the healthcare provider's office take steps to keep other people from getting infected. Ask your healthcare provider to call the local or state health department.  Monitor  your symptoms Seek prompt medical attention if your illness is worsening (e.g., difficulty breathing). Before going to your medical appointment, call the healthcare provider and tell them that you have, or are being evaluated for, COVID-19 infection. Ask your healthcare provider to call the local or state health department.  Wear a facemask You should wear a facemask that covers your  nose and mouth when you are in the same room with other people and when you visit a healthcare provider. People who live with or visit you should also wear a facemask while they are in the same room with you.  Separate yourself from other people in your home As much as possible, you should stay in a different room from other people in your home. Also, you should use a separate bathroom, if available.  Avoid sharing household items You should not share dishes, drinking glasses, cups, eating utensils, towels, bedding, or other items with other people in your home. After using these items, you should wash them thoroughly with soap and water.  Cover your coughs and sneezes Cover your mouth and nose with a tissue when you cough or sneeze, or you can cough or sneeze into your sleeve. Throw used tissues in a lined trash can, and immediately wash your hands with soap and water for at least 20 seconds or use an alcohol-based hand rub.  Wash your [facility] your hands often and thoroughly with soap and water for at least 20 seconds. You can use an alcohol-based hand sanitizer if soap and water are not available and if your hands are not visibly dirty. Avoid touching your eyes, nose, and mouth with unwashed hands.   Prevention Steps for Caregivers and Household Members of Individuals Confirmed to have, or Being Evaluated for, COVID-19 Infection Being Cared for in the Home  If you live with, or provide care at home for, a person confirmed to have, or being evaluated for, COVID-19 infection please follow these guidelines to prevent infection:  Follow healthcare provider's instructions Make sure that you understand and can help the patient follow any healthcare provider instructions for all care.  Provide for the patient's basic needs You should help the patient with basic needs in the home and provide support for getting groceries, prescriptions, and other personal needs.  Monitor the  patient's symptoms If they are getting sicker, call his or her medical provider and tell them that the patient has, or is being evaluated for, COVID-19 infection. This will help the healthcare provider's office take steps to keep other people from getting infected. Ask the healthcare provider to call the local or state health department.  Limit the number of people who have contact with the patient  If possible, have only one caregiver for the patient.  Other household members should stay in another home or place of residence. If this is not possible, they should stay  in another room, or be separated from the patient as much as possible. Use a separate bathroom, if available.  Restrict visitors who do not have an essential need to be in the home.  Keep older adults, very young children, and other sick people away from the patient Keep older adults, very young children, and those who have compromised immune systems or chronic health conditions away from the patient. This includes people with chronic heart, lung, or kidney conditions, diabetes, and cancer.  Ensure good ventilation Make sure that shared spaces in the home have good air flow, such as from an air conditioner or  an opened window, weather permitting.  Wash your hands often  Wash your hands often and thoroughly with soap and water for at least 20 seconds. You can use an alcohol based hand sanitizer if soap and water are not available and if your hands are not visibly dirty.  Avoid touching your eyes, nose, and mouth with unwashed hands.  Use disposable paper towels to dry your hands. If not available, use dedicated cloth towels and replace them when they become wet.  Wear a facemask and gloves  Wear a disposable facemask at all times in the room and gloves when you touch or have contact with the patient's blood, body fluids, and/or secretions or excretions, such as sweat, saliva, sputum, nasal mucus, vomit, urine, or feces.   Ensure the mask fits over your nose and mouth tightly, and do not touch it during use.  Throw out disposable facemasks and gloves after using them. Do not reuse.  Wash your hands immediately after removing your facemask and gloves.  If your personal clothing becomes contaminated, carefully remove clothing and launder. Wash your hands after handling contaminated clothing.  Place all used disposable facemasks, gloves, and other waste in a lined container before disposing them with other household waste.  Remove gloves and wash your hands immediately after handling these items.  Do not share dishes, glasses, or other household items with the patient  Avoid sharing household items. You should not share dishes, drinking glasses, cups, eating utensils, towels, bedding, or other items with a patient who is confirmed to have, or being evaluated for, COVID-19 infection.  After the person uses these items, you should wash them thoroughly with soap and water.  Wash laundry thoroughly  Immediately remove and wash clothes or bedding that have blood, body fluids, and/or secretions or excretions, such as sweat, saliva, sputum, nasal mucus, vomit, urine, or feces, on them.  Wear gloves when handling laundry from the patient.  Read and follow directions on labels of laundry or clothing items and detergent. In general, wash and dry with the warmest temperatures recommended on the label.  Clean all areas the individual has used often  Clean all touchable surfaces, such as counters, tabletops, doorknobs, bathroom fixtures, toilets, phones, keyboards, tablets, and bedside tables, every day. Also, clean any surfaces that may have blood, body fluids, and/or secretions or excretions on them.  Wear gloves when cleaning surfaces the patient has come in contact with.  Use a diluted bleach solution (e.g., dilute bleach with 1 part bleach and 10 parts water) or a household disinfectant with a label that says  EPA-registered for coronaviruses. To make a bleach solution at home, add 1 tablespoon of bleach to 1 quart (4 cups) of water. For a larger supply, add  cup of bleach to 1 gallon (16 cups) of water.  Read labels of cleaning products and follow recommendations provided on product labels. Labels contain instructions for safe and effective use of the cleaning product including precautions you should take when applying the product, such as wearing gloves or eye protection and making sure you have good ventilation during use of the product.  Remove gloves and wash hands immediately after cleaning.  Monitor yourself for signs and symptoms of illness Caregivers and household members are considered close contacts, should monitor their health, and will be asked to limit movement outside of the home to the extent possible. Follow the monitoring steps for close contacts listed on the symptom monitoring form.   ? If you have additional questions,  contact your local health department or call the epidemiologist on call at [phone] (available 24/7). ? This guidance is subject to change. For the most up-to-date guidance from CDC, please refer to their website: YouBlogs.pl    Activity:  As tolerated   Discharge Instructions    Call MD for:  difficulty breathing, headache or visual disturbances   Complete by: As directed    Call MD for:  persistant dizziness or light-headedness   Complete by: As directed    Call MD for:  persistant nausea and vomiting   Complete by: As directed    Call MD for:  severe uncontrolled pain   Complete by: As directed    Diet - low sodium heart healthy   Complete by: As directed    Discharge instructions   Complete by: As directed    Follow with Primary MD  Cletis Athens, MD in 1-2 weeks  Please get a complete blood count and chemistry panel checked by your Primary MD at your next visit, and again as  instructed by your Primary MD.  Get Medicines reviewed and adjusted: Please take all your medications with you for your next visit with your Primary MD  Laboratory/radiological data: Please request your Primary MD to go over all hospital tests and procedure/radiological results at the follow up, please ask your Primary MD to get all Hospital records sent to his/her office.  In some cases, they will be blood work, cultures and biopsy results pending at the time of your discharge. Please request that your primary care M.D. follows up on these results.  Also Note the following: If you experience worsening of your admission symptoms, develop shortness of breath, life threatening emergency, suicidal or homicidal thoughts you must seek medical attention immediately by calling 911 or calling your MD immediately  if symptoms less severe.  You must read complete instructions/literature along with all the possible adverse reactions/side effects for all the Medicines you take and that have been prescribed to you. Take any new Medicines after you have completely understood and accpet all the possible adverse reactions/side effects.   Do not drive when taking Pain medications or sleeping medications (Benzodaizepines)  Do not take more than prescribed Pain, Sleep and Anxiety Medications. It is not advisable to combine anxiety,sleep and pain medications without talking with your primary care practitioner  Special Instructions: If you have smoked or chewed Tobacco  in the last 2 yrs please stop smoking, stop any regular Alcohol  and or any Recreational drug use.  Wear Seat belts while driving.  Please note: You were cared for by a hospitalist during your hospital stay. Once you are discharged, your primary care physician will handle any further medical issues. Please note that NO REFILLS for any discharge medications will be authorized once you are discharged, as it is imperative that you return to your  primary care physician (or establish a relationship with a primary care physician if you do not have one) for your post hospital discharge needs so that they can reassess your need for medications and monitor your lab values.   1. 3 weeks of isolation from 11/18/19   Increase activity slowly   Complete by: As directed      Allergies as of 11/24/2019   No Known Allergies     Medication List    TAKE these medications   acetaminophen 500 MG tablet Commonly known as: TYLENOL Take 500 mg by mouth every 6 (six) hours as needed for mild pain or moderate  pain.   aspirin EC 81 MG tablet Take by mouth.   atorvastatin 40 MG tablet Commonly known as: LIPITOR Take 40 mg by mouth every evening.   loratadine 10 MG tablet Commonly known as: CLARITIN Take 10 mg by mouth every evening.   metoprolol tartrate 25 MG tablet Commonly known as: LOPRESSOR Take 1 tablet by mouth every 12 (twelve) hours.   predniSONE 10 MG tablet Commonly known as: DELTASONE Take 40 mg daily for 1 day, 30 mg daily for 1 day, 20 mg daily for 1 days,10 mg daily for 1 day, then stop            Durable Medical Equipment  (From admission, onward)         Start     Ordered   11/23/19 0714  For home use only DME oxygen  Once    Question Answer Comment  Length of Need 6 Months   Mode or (Route) Nasal cannula   Liters per Minute 2   Frequency Continuous (stationary and portable oxygen unit needed)   Oxygen conserving device Yes   Oxygen delivery system Gas      11/23/19 0713         Follow-up Information    Cletis Athens, MD. Schedule an appointment as soon as possible for a visit in 1 week(s).   Specialty: Internal Medicine Contact information: [place] [address] [phone]          No Known Allergies  Consultations:   None   Other Procedures/Studies: CT ANGIO CHEST PE W OR WO CONTRAST  Result Date: 11/19/2019 CLINICAL DATA:  Worsening shortness of breath 4 days.  COVID-19 positive 10 days. Negative COVID-19 test today. Positive D-dimer. EXAM: CT ANGIOGRAPHY CHEST WITH CONTRAST TECHNIQUE: Multidetector CT imaging of the chest was performed using the standard protocol during bolus administration of intravenous contrast. Multiplanar CT image reconstructions and MIPs were obtained to evaluate the vascular anatomy. CONTRAST:  131mL OMNIPAQUE IOHEXOL 350 MG/ML SOLN COMPARISON:  None. FINDINGS: Cardiovascular: Median sternotomy wires are present. Mild cardiomegaly. Calcified plaque over the coronary arteries. Thoracic aorta is normal in caliber without aneurysm or dissection. Minimal calcified plaque over the aortic arch. Pulmonary arterial system is well opacified without evidence of emboli. Mediastinum/Nodes: 1 cm precarinal lymph node is present. There is a 1.9 cm right subcarinal lymph node no significant hilar adenopathy. Remaining mediastinal structures are unremarkable. Lungs/Pleura: Lungs are adequately inflated and demonstrate a patchy bilateral peripheral predominant airspace process worse over the mid to lower lungs. Findings likely due to multifocal pneumonia. No evidence of effusion 1 cm subpleural granuloma over the posteromedial right lower lobe. Airways are normal. Upper Abdomen: No acute findings. Musculoskeletal: Degenerative change of the spine. Review of the MIP images confirms the above findings. IMPRESSION: 1.  No evidence of pulmonary embolism. 2. Patchy bilateral airspace process likely multifocal pneumonia. Mild associated mediastinal adenopathy likely reactive. 3. Aortic Atherosclerosis (ICD10-I70.0). Atherosclerotic coronary artery disease. Electronically Signed   By: Marin Olp M.D.   On: 11/19/2019 11:27   DG Chest Portable 1 View  Result Date: 11/18/2019 CLINICAL DATA:  Shortness of breath and COVID-19 positivity for several days EXAM: PORTABLE CHEST 1 VIEW COMPARISON:  None. FINDINGS: Cardiac shadow is mildly prominent. Postsurgical changes are  seen. Patchy airspace opacities are noted left greater than right likely related to the underlying clinical history. No sizable effusion is seen. No bony abnormality is noted. IMPRESSION: Patchy airspace opacities consistent with the given clinical history. Electronically  Signed   By: Inez Catalina M.D.   On: 11/18/2019 21:34     TODAY-DAY OF DISCHARGE:  Subjective:   Thomas Hood today has no headache,no chest abdominal pain,no new weakness tingling or numbness, feels much better wants to go home today.   Objective:   Blood pressure (!) 129/95, pulse (!) 51, temperature 97.7 F (36.5 C), temperature source Oral, resp. rate 18, height 5\' 5"  (1.651 m), weight 108.9 kg, SpO2 93 %.  Intake/Output Summary (Last 24 hours) at 11/24/2019 1033 Last data filed at 11/24/2019 1016 Gross per 24 hour  Intake 840 ml  Output 400 ml  Net 440 ml   Filed Weights   11/18/19 2036  Weight: 108.9 kg    Exam: Awake Alert, Oriented *3, No new F.N deficits, Normal affect [place].AT,PERRAL Supple Neck,No JVD, No cervical lymphadenopathy appriciated.  Symmetrical Chest wall movement, Good air movement bilaterally, CTAB RRR,No Gallops,Rubs or new Murmurs, No Parasternal Heave +ve B.Sounds, Abd Soft, Non tender, No organomegaly appriciated, No rebound -guarding or rigidity. No Cyanosis, Clubbing or edema, No new Rash or bruise   PERTINENT RADIOLOGIC STUDIES: CT ANGIO CHEST PE W OR WO CONTRAST  Result Date: 11/19/2019 CLINICAL DATA:  Worsening shortness of breath 4 days. COVID-19 positive 10 days. Negative COVID-19 test today. Positive D-dimer. EXAM: CT ANGIOGRAPHY CHEST WITH CONTRAST TECHNIQUE: Multidetector CT imaging of the chest was performed using the standard protocol during bolus administration of intravenous contrast. Multiplanar CT image reconstructions and MIPs were obtained to evaluate the vascular anatomy. CONTRAST:  190mL OMNIPAQUE IOHEXOL 350 MG/ML SOLN COMPARISON:  None. FINDINGS: Cardiovascular:  Median sternotomy wires are present. Mild cardiomegaly. Calcified plaque over the coronary arteries. Thoracic aorta is normal in caliber without aneurysm or dissection. Minimal calcified plaque over the aortic arch. Pulmonary arterial system is well opacified without evidence of emboli. Mediastinum/Nodes: 1 cm precarinal lymph node is present. There is a 1.9 cm right subcarinal lymph node no significant hilar adenopathy. Remaining mediastinal structures are unremarkable. Lungs/Pleura: Lungs are adequately inflated and demonstrate a patchy bilateral peripheral predominant airspace process worse over the mid to lower lungs. Findings likely due to multifocal pneumonia. No evidence of effusion 1 cm subpleural granuloma over the posteromedial right lower lobe. Airways are normal. Upper Abdomen: No acute findings. Musculoskeletal: Degenerative change of the spine. Review of the MIP images confirms the above findings. IMPRESSION: 1.  No evidence of pulmonary embolism. 2. Patchy bilateral airspace process likely multifocal pneumonia. Mild associated mediastinal adenopathy likely reactive. 3. Aortic Atherosclerosis (ICD10-I70.0). Atherosclerotic coronary artery disease. Electronically Signed   By: Marin Olp M.D.   On: 11/19/2019 11:27   DG Chest Portable 1 View  Result Date: 11/18/2019 CLINICAL DATA:  Shortness of breath and COVID-19 positivity for several days EXAM: PORTABLE CHEST 1 VIEW COMPARISON:  None. FINDINGS: Cardiac shadow is mildly prominent. Postsurgical changes are seen. Patchy airspace opacities are noted left greater than right likely related to the underlying clinical history. No sizable effusion is seen. No bony abnormality is noted. IMPRESSION: Patchy airspace opacities consistent with the given clinical history. Electronically Signed   By: Inez Catalina M.D.   On: 11/18/2019 21:34     PERTINENT LAB RESULTS: CBC: Recent Labs    11/23/19 0520 11/24/19 0526  WBC 12.9* 11.1*  HGB 14.1 14.0    HCT 42.5 42.5  PLT 326 323   CMET CMP     Component Value Date/Time   NA 138 11/24/2019 0526   K 4.4 11/24/2019 0526  CL 103 11/24/2019 0526   CO2 24 11/24/2019 0526   GLUCOSE 150 (H) 11/24/2019 0526   BUN 35 (H) 11/24/2019 0526   CREATININE 0.91 11/24/2019 0526   CALCIUM 8.4 (L) 11/24/2019 0526   PROT 6.3 (L) 11/24/2019 0526   ALBUMIN 2.9 (L) 11/24/2019 0526   AST 74 (H) 11/24/2019 0526   ALT 118 (H) 11/24/2019 0526   ALKPHOS 64 11/24/2019 0526   BILITOT 0.9 11/24/2019 0526   GFRNONAA >60 11/24/2019 0526   GFRAA >60 11/24/2019 0526    GFR Estimated Creatinine Clearance: 92.1 mL/min (by C-G formula based on SCr of 0.91 mg/dL). No results for input(s): LIPASE, AMYLASE in the last 72 hours. No results for input(s): CKTOTAL, CKMB, CKMBINDEX, TROPONINI in the last 72 hours. Invalid input(s): POCBNP Recent Labs    11/23/19 0520 11/24/19 0526  DDIMER 1.20* 1.15*   Recent Labs    11/22/19 0700  HGBA1C 6.6*   No results for input(s): CHOL, HDL, LDLCALC, TRIG, CHOLHDL, LDLDIRECT in the last 72 hours. No results for input(s): TSH, T4TOTAL, T3FREE, THYROIDAB in the last 72 hours.  Invalid input(s): [place]    11/23/19 0520 11/24/19 0525  FERRITIN 681* 655*   Coags: No results for input(s): INR in the last 72 hours.  Invalid input(s): PT Microbiology: Recent Results (from the past 240 hour(s))  Respiratory Panel by RT PCR (Flu A&B, Covid) - Nasopharyngeal Swab     Status: None   Collection Time: 11/18/19  9:41 PM   Specimen: Nasopharyngeal Swab  Result Value Ref Range Status   SARS Coronavirus 2 by RT PCR NEGATIVE NEGATIVE Final    Comment: (NOTE) SARS-CoV-2 target nucleic acids are NOT DETECTED. The SARS-CoV-2 RNA is generally detectable in upper respiratoy specimens during the acute phase of infection. The lowest concentration of SARS-CoV-2 viral copies this assay can detect is 131 copies/mL. A negative result does not preclude  SARS-Cov-2 infection and should not be used as the sole basis for treatment or other patient management decisions. A negative result may occur with  improper specimen collection/handling, submission of specimen other than nasopharyngeal swab, presence of viral mutation(s) within the areas targeted by this assay, and inadequate number of viral copies (<131 copies/mL). A negative result must be combined with clinical observations, patient history, and epidemiological information. The expected result is Negative. Fact Sheet for Patients:  PinkCheek.be Fact Sheet for Healthcare Providers:  GravelBags.it This test is not yet ap proved or cleared by the [place] FDA and  has been authorized for detection and/or diagnosis of SARS-CoV-2 by FDA under an Emergency Use Authorization (EUA). This EUA will remain  in effect (meaning this test can be used) for the duration of the COVID-19 declaration under Section 564(b)(1) of the Act, 21 U.S.C. section 360bbb-3(b)(1), unless the authorization is terminated or revoked sooner.    Influenza A by PCR NEGATIVE NEGATIVE Final   Influenza B by PCR NEGATIVE NEGATIVE Final    Comment: (NOTE) The Xpert Xpress SARS-CoV-2/FLU/RSV assay is intended as an aid in  the diagnosis of influenza from Nasopharyngeal swab specimens and  should not be used as a sole basis for treatment. Nasal washings and  aspirates are unacceptable for Xpert Xpress SARS-CoV-2/FLU/RSV  testing. Fact Sheet for Patients: PinkCheek.be Fact Sheet for Healthcare Providers: GravelBags.it This test is not yet approved or cleared by the [place] FDA and  has been authorized for detection and/or diagnosis of SARS-CoV-2 by  FDA under an Emergency Use Authorization (EUA). This EUA  will remain  in effect (meaning this test can be used) for the duration of the  Covid-19  declaration under Section 564(b)(1) of the Act, 21  U.S.C. section 360bbb-3(b)(1), unless the authorization is  terminated or revoked. Performed at [facility], [address] [place], [place] [address]   SARS CORONAVIRUS 2 (TAT 6-24 HRS) Nasopharyngeal Nasopharyngeal Swab     Status: Abnormal   Collection Time: 11/18/19  9:44 PM   Specimen: Nasopharyngeal Swab  Result Value Ref Range Status   SARS Coronavirus 2 POSITIVE (A) NEGATIVE Final    Comment: RESULT CALLED TO, READ BACK BY AND VERIFIED WITH: Okey Regal RN 15:45 11/19/19 ([email or web address]) (NOTE) SARS-CoV-2 target nucleic acids are DETECTED. The SARS-CoV-2 RNA is generally detectable in upper and lower respiratory specimens during the acute phase of infection. Positive results are indicative of the presence of SARS-CoV-2 RNA. Clinical correlation with patient history and other diagnostic information is  necessary to determine patient infection status. Positive results do not rule out bacterial infection or co-infection with other viruses.  The expected result is Negative. Fact Sheet for Patients: SugarRoll.be Fact Sheet for Healthcare Providers: https://www.woods-mathews.com/ This test is not yet approved or cleared by the [place] FDA and  has been authorized for detection and/or diagnosis of SARS-CoV-2 by FDA under an Emergency Use Authorization (EUA). This EUA will remain  in effect (meaning this test can be used) for  the duration of the COVID-19 declaration under Section 564(b)(1) of the Act, 21 U.S.C. section 360bbb-3(b)(1), unless the authorization is terminated or revoked sooner. Performed at [facility], [place] [address] [place], [place] [address]   Blood Culture (routine x 2)     Status: None   Collection Time: 11/18/19 10:21 PM   Specimen: BLOOD  Result Value Ref Range Status   Specimen Description BLOOD LEFT ANTECUBITAL  Final   Special Requests   Final     BOTTLES DRAWN AEROBIC AND ANAEROBIC Blood Culture adequate volume   Culture   Final    NO GROWTH 5 DAYS Performed at [facility], [address], [place], [place] [address]    Report Status 11/23/2019 FINAL  Final  Blood Culture (routine x 2)     Status: None   Collection Time: 11/18/19 10:29 PM   Specimen: BLOOD  Result Value Ref Range Status   Specimen Description BLOOD LEFT ANTECUBITAL  Final   Special Requests   Final    BOTTLES DRAWN AEROBIC AND ANAEROBIC Blood Culture adequate volume   Culture   Final    NO GROWTH 5 DAYS Performed at [facility], [address] [place], [place] [address]    Report Status 11/23/2019 FINAL  Final    FURTHER DISCHARGE INSTRUCTIONS:  Get Medicines reviewed and adjusted: Please take all your medications with you for your next visit with your Primary MD  Laboratory/radiological data: Please request your Primary MD to go over all hospital tests and procedure/radiological results at the follow up, please ask your Primary MD to get all Hospital records sent to his/her office.  In some cases, they will be blood work, cultures and biopsy results pending at the time of your discharge. Please request that your primary care M.D. goes through all the records of your hospital data and follows up on these results.  Also Note the following: If you experience worsening of your admission symptoms, develop shortness of breath, life threatening emergency, suicidal or homicidal thoughts you must seek medical attention immediately by calling 911 or  calling your MD immediately  if symptoms less severe.  You must read complete instructions/literature along with all the possible adverse reactions/side effects for all the Medicines you take and that have been prescribed to you. Take any new Medicines after you have completely understood and accpet all the possible adverse reactions/side effects.   Do not drive when taking Pain medications or sleeping medications  (Benzodaizepines)  Do not take more than prescribed Pain, Sleep and Anxiety Medications. It is not advisable to combine anxiety,sleep and pain medications without talking with your primary care practitioner  Special Instructions: If you have smoked or chewed Tobacco  in the last 2 yrs please stop smoking, stop any regular Alcohol  and or any Recreational drug use.  Wear Seat belts while driving.  Please note: You were cared for by a hospitalist during your hospital stay. Once you are discharged, your primary care physician will handle any further medical issues. Please note that NO REFILLS for any discharge medications will be authorized once you are discharged, as it is imperative that you return to your primary care physician (or establish a relationship with a primary care physician if you do not have one) for your post hospital discharge needs so that they can reassess your need for medications and monitor your lab values.  Total Time spent coordinating discharge including counseling, education and face to face time equals 35 minutes.  SignedOren Binet 11/24/2019 10:33 AM

## 2019-11-24 NOTE — Progress Notes (Signed)
SATURATION QUALIFICATIONS: (This note is used to comply with regulatory documentation for home oxygen)  Patient Saturations on Room Air at Rest = 94%  Patient Saturations on Room Air while Ambulating = 86%  Patient Saturations on 2 Liters of oxygen while Ambulating = 87%  Please briefly explain why patient needs home oxygen: Patient's oxygen saturation drops during activity.

## 2019-11-24 NOTE — Care Management Important Message (Signed)
Important Message  Patient Details  Name: DEASHAWN BOWDER MRN: [id number] Date of Birth: [birth year]   Medicare Important Message Given:  Yes - Important Message mailed due to current National Emergency  Verbal consent obtained due to current National Emergency  Relationship to patient: Spouse/Significant Other Contact Name: [place] Call Date: 11/24/19  Time: 1359 Phone: [phone] Outcome: Spoke with contact Important Message mailed to: Patient address on file    Delorse Lek 11/24/2019, 1:59 PM

## 2019-12-14 DIAGNOSIS — R42 Dizziness and giddiness: Secondary | ICD-10-CM | POA: Diagnosis not present

## 2019-12-14 DIAGNOSIS — E669 Obesity, unspecified: Secondary | ICD-10-CM | POA: Diagnosis not present

## 2019-12-14 DIAGNOSIS — I2510 Atherosclerotic heart disease of native coronary artery without angina pectoris: Secondary | ICD-10-CM | POA: Diagnosis not present

## 2019-12-14 DIAGNOSIS — I517 Cardiomegaly: Secondary | ICD-10-CM | POA: Diagnosis not present

## 2019-12-31 DIAGNOSIS — H906 Mixed conductive and sensorineural hearing loss, bilateral: Secondary | ICD-10-CM | POA: Diagnosis not present

## 2019-12-31 DIAGNOSIS — H6122 Impacted cerumen, left ear: Secondary | ICD-10-CM | POA: Diagnosis not present

## 2019-12-31 DIAGNOSIS — H7190 Unspecified cholesteatoma, unspecified ear: Secondary | ICD-10-CM | POA: Diagnosis not present

## 2020-01-04 DIAGNOSIS — I517 Cardiomegaly: Secondary | ICD-10-CM | POA: Diagnosis not present

## 2020-01-04 DIAGNOSIS — I2510 Atherosclerotic heart disease of native coronary artery without angina pectoris: Secondary | ICD-10-CM | POA: Diagnosis not present

## 2020-01-04 DIAGNOSIS — N529 Male erectile dysfunction, unspecified: Secondary | ICD-10-CM | POA: Diagnosis not present

## 2020-01-04 DIAGNOSIS — R42 Dizziness and giddiness: Secondary | ICD-10-CM | POA: Diagnosis not present

## 2020-01-18 DIAGNOSIS — H6522 Chronic serous otitis media, left ear: Secondary | ICD-10-CM | POA: Diagnosis not present

## 2020-02-01 DIAGNOSIS — I2510 Atherosclerotic heart disease of native coronary artery without angina pectoris: Secondary | ICD-10-CM | POA: Diagnosis not present

## 2020-02-01 DIAGNOSIS — Z01818 Encounter for other preprocedural examination: Secondary | ICD-10-CM | POA: Diagnosis not present

## 2020-02-01 DIAGNOSIS — E782 Mixed hyperlipidemia: Secondary | ICD-10-CM | POA: Diagnosis not present

## 2020-02-01 DIAGNOSIS — I10 Essential (primary) hypertension: Secondary | ICD-10-CM | POA: Diagnosis not present

## 2020-02-01 DIAGNOSIS — K2100 Gastro-esophageal reflux disease with esophagitis, without bleeding: Secondary | ICD-10-CM | POA: Diagnosis not present

## 2020-02-10 DIAGNOSIS — G4731 Primary central sleep apnea: Secondary | ICD-10-CM | POA: Diagnosis not present

## 2020-02-10 DIAGNOSIS — I517 Cardiomegaly: Secondary | ICD-10-CM | POA: Diagnosis not present

## 2020-02-10 DIAGNOSIS — Z01818 Encounter for other preprocedural examination: Secondary | ICD-10-CM | POA: Diagnosis not present

## 2020-02-10 DIAGNOSIS — I2510 Atherosclerotic heart disease of native coronary artery without angina pectoris: Secondary | ICD-10-CM | POA: Diagnosis not present

## 2020-02-11 DIAGNOSIS — Z23 Encounter for immunization: Secondary | ICD-10-CM | POA: Diagnosis not present

## 2020-02-15 DIAGNOSIS — Z20822 Contact with and (suspected) exposure to covid-19: Secondary | ICD-10-CM | POA: Diagnosis not present

## 2020-02-15 DIAGNOSIS — Z01818 Encounter for other preprocedural examination: Secondary | ICD-10-CM | POA: Diagnosis not present

## 2020-02-15 DIAGNOSIS — H6522 Chronic serous otitis media, left ear: Secondary | ICD-10-CM | POA: Diagnosis not present

## 2020-02-16 DIAGNOSIS — I2510 Atherosclerotic heart disease of native coronary artery without angina pectoris: Secondary | ICD-10-CM | POA: Diagnosis not present

## 2020-02-16 DIAGNOSIS — Z6841 Body Mass Index (BMI) 40.0 and over, adult: Secondary | ICD-10-CM | POA: Diagnosis not present

## 2020-02-16 DIAGNOSIS — E669 Obesity, unspecified: Secondary | ICD-10-CM | POA: Diagnosis not present

## 2020-02-16 DIAGNOSIS — I10 Essential (primary) hypertension: Secondary | ICD-10-CM | POA: Diagnosis not present

## 2020-02-16 DIAGNOSIS — Z951 Presence of aortocoronary bypass graft: Secondary | ICD-10-CM | POA: Diagnosis not present

## 2020-02-16 DIAGNOSIS — Z79899 Other long term (current) drug therapy: Secondary | ICD-10-CM | POA: Diagnosis not present

## 2020-02-16 DIAGNOSIS — Z7982 Long term (current) use of aspirin: Secondary | ICD-10-CM | POA: Diagnosis not present

## 2020-02-16 DIAGNOSIS — H6622 Chronic atticoantral suppurative otitis media, left ear: Secondary | ICD-10-CM | POA: Diagnosis not present

## 2020-02-25 IMAGING — CT CT ANGIO CHEST
2 of 6 series · 19 of 46 positions shown · IV contrast (Omnipaque or Isovue)
Comparison: None.

CLINICAL DATA: Worsening shortness of breath 4 days. KNGV4-KZ
positive 10 days. Negative KNGV4-KZ test today. Positive D-dimer.

EXAM:
CT ANGIOGRAPHY CHEST WITH CONTRAST
TECHNIQUE: Multidetector CT imaging of the chest was performed using the
standard protocol during bolus administration of intravenous
contrast. Multiplanar CT image reconstructions and MIPs were
obtained to evaluate the vascular anatomy.
CONTRAST:  100mL OMNIPAQUE IOHEXOL 350 MG/ML SOLN

[Series 5: pe axial thins · axial · 0.69mm/px · z∈[+1367,+1638]mm · 16 of 297 slices shown]
[im 13/297  lung]
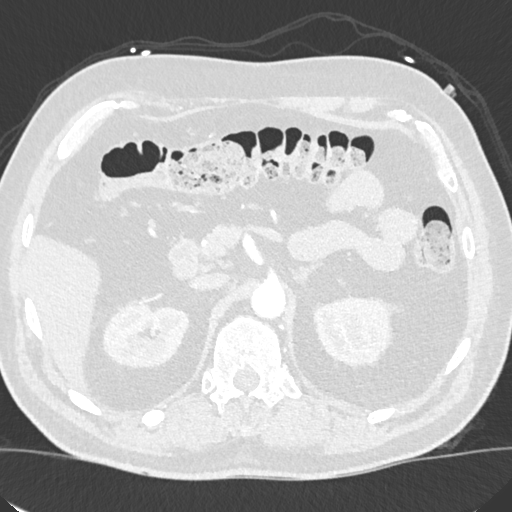
[im 39/297  soft-tissue]
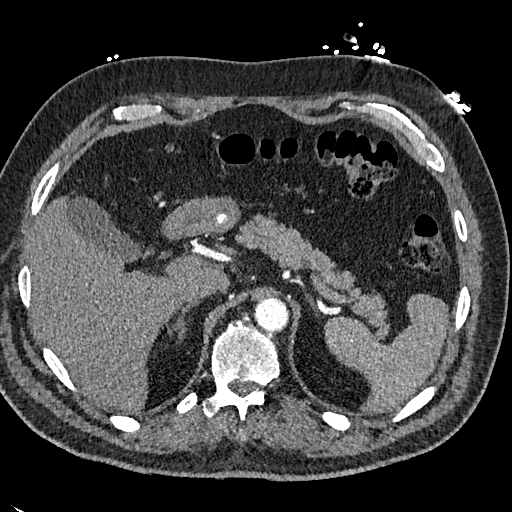
[im 52/297  lung]
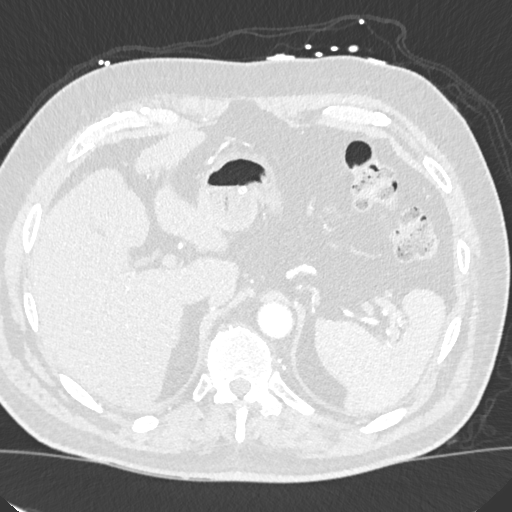
[im 65/297  soft-tissue]
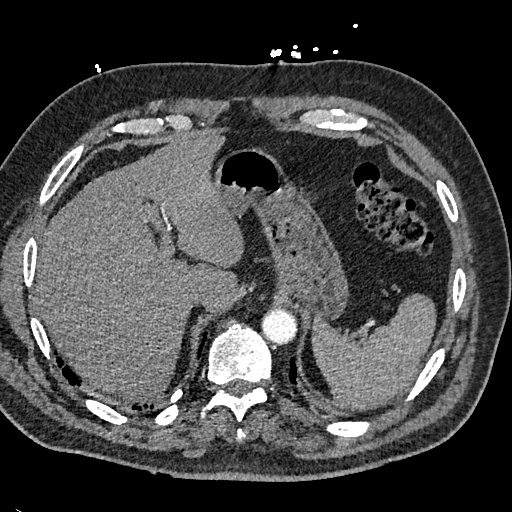
[im 91/297  lung]
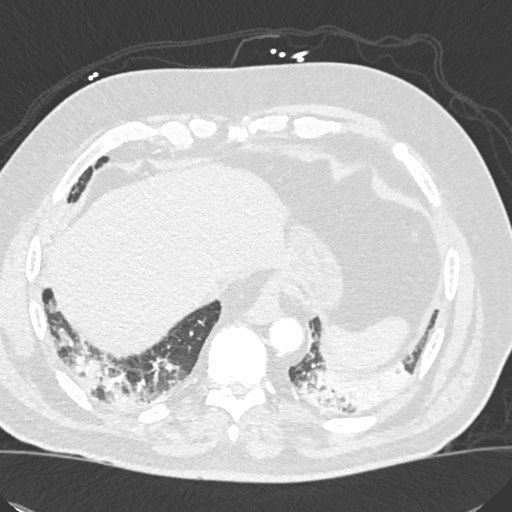
[im 103/297  soft-tissue]
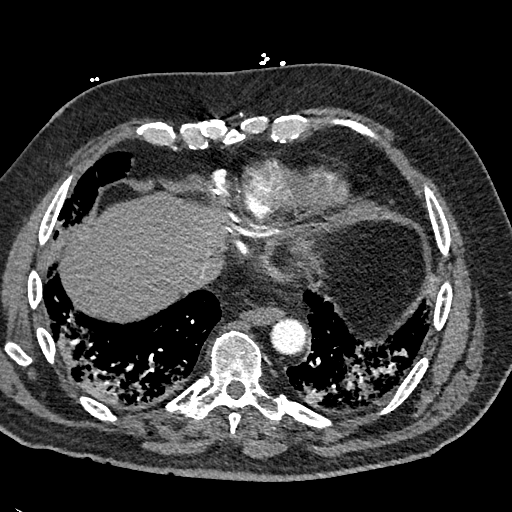
[im 116/297  lung]
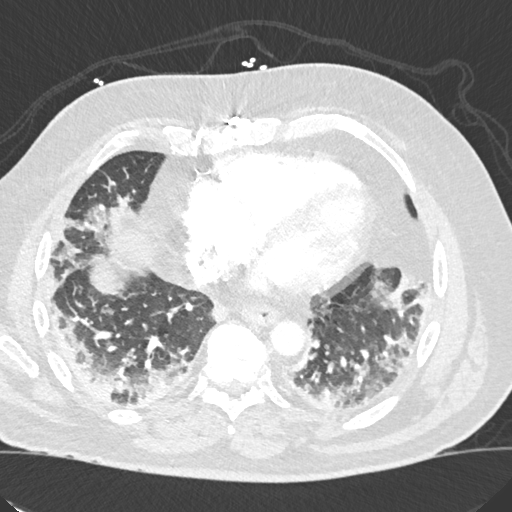
[im 142/297  soft-tissue]
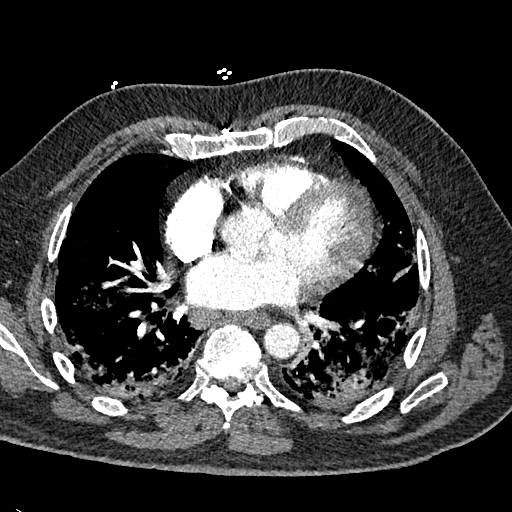
[im 155/297  lung]
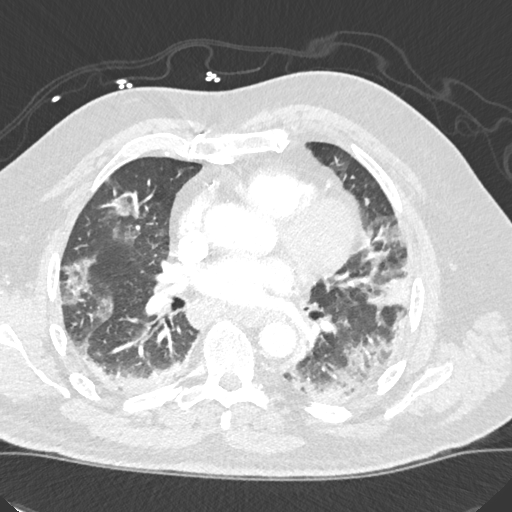
[im 181/297  soft-tissue]
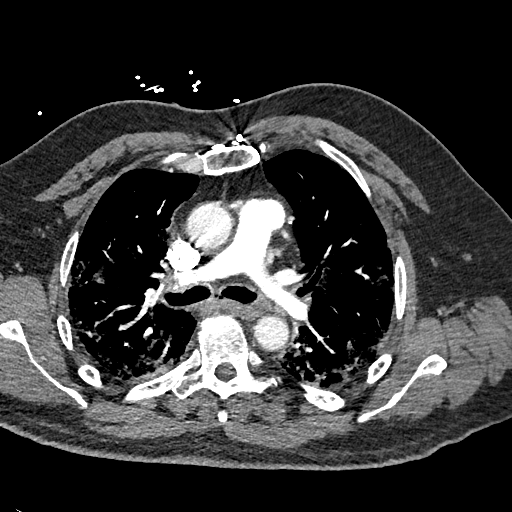
[im 194/297  lung]
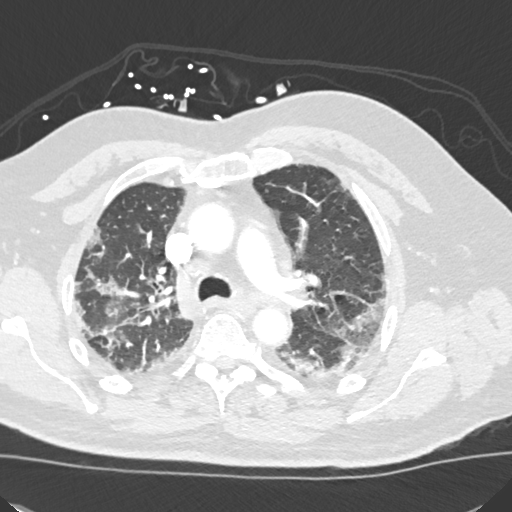
[im 206/297  soft-tissue]
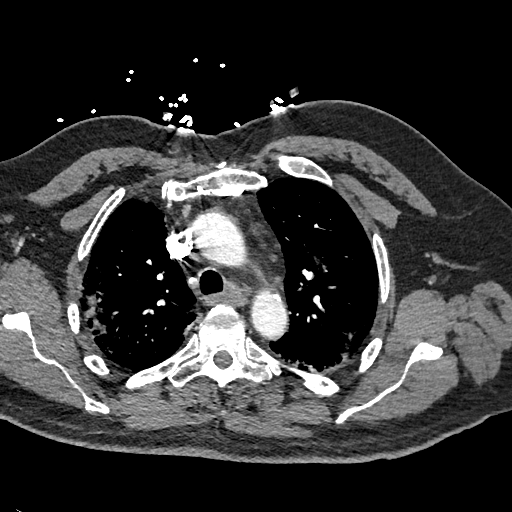
[im 232/297  lung]
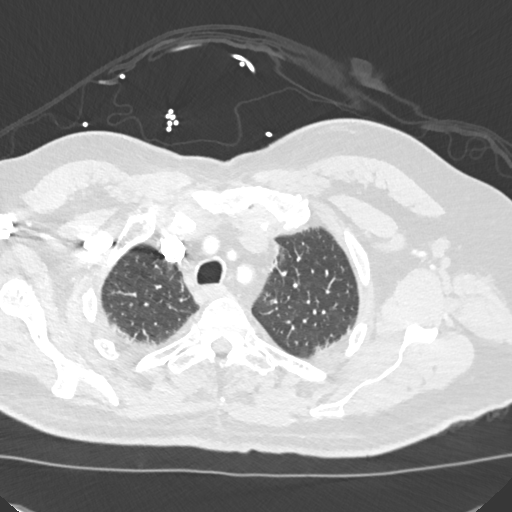
[im 245/297  soft-tissue]
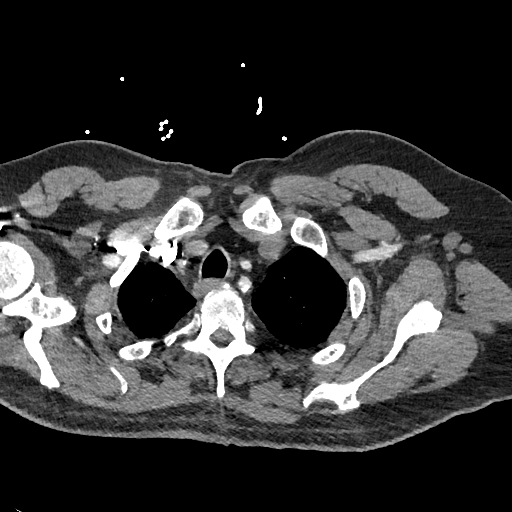
[im 258/297  lung]
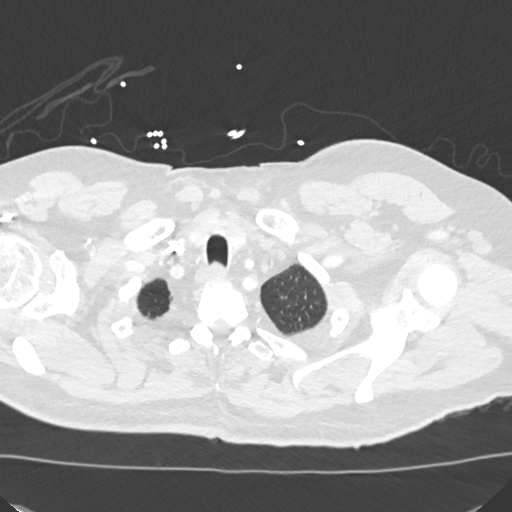
[im 284/297  soft-tissue]
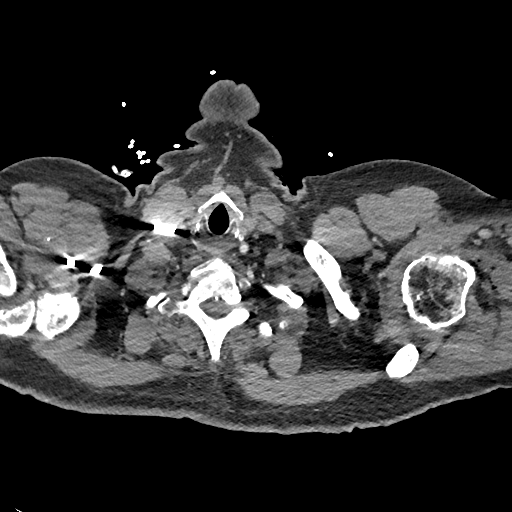

[Series 7: cor soft · coronal · 0.68mm/px · 3 of 151 slices shown]
[im 38/151  soft-tissue]
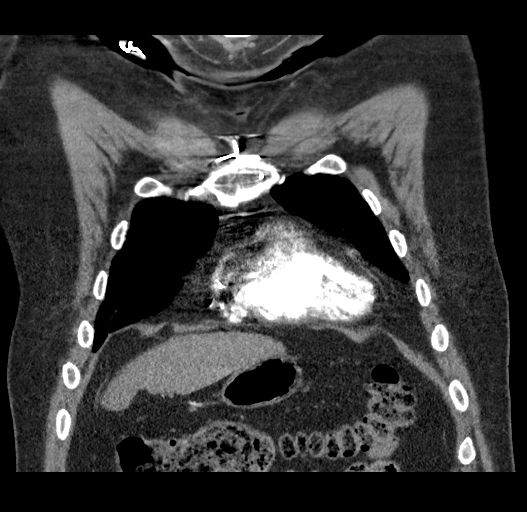
[im 76/151  soft-tissue]
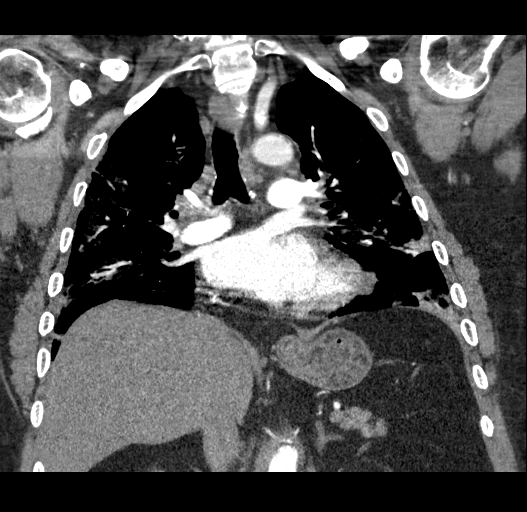
[im 113/151  soft-tissue]
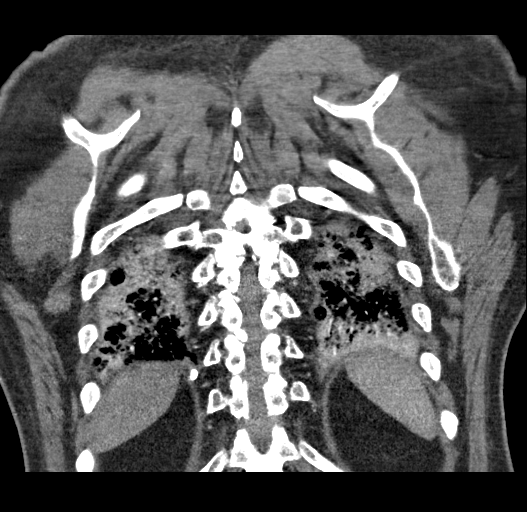

[19 of 46 positions shown; findings below may reference images not displayed]

FINDINGS: Cardiovascular: Median sternotomy wires are present. Mild
cardiomegaly. Calcified plaque over the coronary arteries. Thoracic
aorta is normal in caliber without aneurysm or dissection. Minimal
calcified plaque over the aortic arch. Pulmonary arterial system is
well opacified without evidence of emboli.

Mediastinum/Nodes: 1 cm precarinal lymph node is present. There is a
1.9 cm right subcarinal lymph node no significant hilar adenopathy.
Remaining mediastinal structures are unremarkable.

Lungs/Pleura: Lungs are adequately inflated and demonstrate a patchy
bilateral peripheral predominant airspace process worse over the mid
to lower lungs. Findings likely due to multifocal pneumonia. No
evidence of effusion 1 cm subpleural granuloma over the
posteromedial right lower lobe. Airways are normal.

Upper Abdomen: No acute findings.

Musculoskeletal: Degenerative change of the spine.

Review of the MIP images confirms the above findings.
IMPRESSION: 1.  No evidence of pulmonary embolism.

2. Patchy bilateral airspace process likely multifocal pneumonia.
Mild associated mediastinal adenopathy likely reactive.

3. Aortic Atherosclerosis ([id number]). Atherosclerotic coronary
artery disease.

## 2020-03-03 DIAGNOSIS — H906 Mixed conductive and sensorineural hearing loss, bilateral: Secondary | ICD-10-CM | POA: Diagnosis not present

## 2020-03-10 DIAGNOSIS — Z23 Encounter for immunization: Secondary | ICD-10-CM | POA: Diagnosis not present

## 2020-05-02 DIAGNOSIS — H906 Mixed conductive and sensorineural hearing loss, bilateral: Secondary | ICD-10-CM | POA: Diagnosis not present

## 2020-05-18 DIAGNOSIS — E6609 Other obesity due to excess calories: Secondary | ICD-10-CM | POA: Diagnosis not present

## 2020-05-18 DIAGNOSIS — N5201 Erectile dysfunction due to arterial insufficiency: Secondary | ICD-10-CM | POA: Diagnosis not present

## 2020-05-18 DIAGNOSIS — R351 Nocturia: Secondary | ICD-10-CM | POA: Diagnosis not present

## 2020-05-18 DIAGNOSIS — Z125 Encounter for screening for malignant neoplasm of prostate: Secondary | ICD-10-CM | POA: Diagnosis not present

## 2020-05-18 DIAGNOSIS — N401 Enlarged prostate with lower urinary tract symptoms: Secondary | ICD-10-CM | POA: Diagnosis not present

## 2020-05-30 ENCOUNTER — Ambulatory Visit: Payer: Medicare Other | Admitting: Internal Medicine

## 2020-10-11 DIAGNOSIS — R432 Parageusia: Secondary | ICD-10-CM | POA: Diagnosis not present

## 2020-10-11 DIAGNOSIS — R499 Unspecified voice and resonance disorder: Secondary | ICD-10-CM | POA: Diagnosis not present

## 2020-10-11 DIAGNOSIS — R438 Other disturbances of smell and taste: Secondary | ICD-10-CM | POA: Diagnosis not present

## 2020-10-11 DIAGNOSIS — R251 Tremor, unspecified: Secondary | ICD-10-CM | POA: Diagnosis not present

## 2020-10-11 DIAGNOSIS — G2581 Restless legs syndrome: Secondary | ICD-10-CM | POA: Diagnosis not present

## 2020-11-02 ENCOUNTER — Other Ambulatory Visit: Payer: Self-pay | Admitting: Internal Medicine

## 2020-11-10 ENCOUNTER — Encounter: Payer: Self-pay | Admitting: Internal Medicine

## 2020-11-10 ENCOUNTER — Other Ambulatory Visit: Payer: Self-pay

## 2020-11-10 ENCOUNTER — Ambulatory Visit (INDEPENDENT_AMBULATORY_CARE_PROVIDER_SITE_OTHER): Payer: Medicare Other | Admitting: Internal Medicine

## 2020-11-10 VITALS — BP 136/84 | HR 63 | Ht 65.0 in | Wt 244.8 lb

## 2020-11-10 DIAGNOSIS — Z0000 Encounter for general adult medical examination without abnormal findings: Secondary | ICD-10-CM | POA: Insufficient documentation

## 2020-11-10 DIAGNOSIS — R12 Heartburn: Secondary | ICD-10-CM | POA: Diagnosis not present

## 2020-11-10 DIAGNOSIS — I10 Essential (primary) hypertension: Secondary | ICD-10-CM

## 2020-11-10 DIAGNOSIS — J301 Allergic rhinitis due to pollen: Secondary | ICD-10-CM | POA: Diagnosis not present

## 2020-11-10 DIAGNOSIS — I2510 Atherosclerotic heart disease of native coronary artery without angina pectoris: Secondary | ICD-10-CM | POA: Diagnosis not present

## 2020-11-10 NOTE — Progress Notes (Signed)
Established Patient Office Visit  Subjective:  Patient ID: Thomas Hood, male    DOB: [birth year]  Age: 64 y.o. MRN: [id number]  CC:  Chief Complaint  Patient presents with  . Annual Exam    HPI  Thomas Hood presents for physical.  64 year old white Caucasian male given history of bypass surgery about 6 years ago.  He does not have any chest pain or shortness of breath, no palpitation he does not give any history of passing out spell.  He has some tremors of the both hands and has been seen by a neurologist who thinks that he may have early parkinsonian disease.    Past Medical History:  Diagnosis Date  . Arthritis   . Atherosclerosis   . CAD (coronary artery disease)   . Dizziness   . HTN (hypertension)   . Mitral valve prolapse     Past Surgical History:  Procedure Laterality Date  . CHOLESTEATOMA EXCISION Right   . COLONOSCOPY     3-4 yrs. ago  Hx polyps  . COLONOSCOPY WITH PROPOFOL N/A 05/01/2018   Procedure: COLONOSCOPY WITH PROPOFOL;  Surgeon: Virgel Manifold, MD;  Location: [facility] ENDOSCOPY;  Service: Endoscopy;  Laterality: N/A;  . CORONARY ARTERY BYPASS GRAFT    . ESOPHAGOGASTRODUODENOSCOPY (EGD) WITH PROPOFOL N/A 05/01/2018   Procedure: ESOPHAGOGASTRODUODENOSCOPY (EGD) WITH PROPOFOL;  Surgeon: Virgel Manifold, MD;  Location: [facility] ENDOSCOPY;  Service: Endoscopy;  Laterality: N/A;  . TYMPANOPLASTY W/ MASTOIDECTOMY  08/23/2015    Family History  Problem Relation Age of Onset  . Cancer Mother        colon  . Cancer Sister        lung cancer-hx smoking  . Heart failure Sister     Social History   Socioeconomic History  . Marital status: Single    Spouse name: Not on file  . Number of children: Not on file  . Years of education: Not on file  . Highest education level: Not on file  Occupational History  . Not on file  Tobacco Use  . Smoking status: Former Research scientist (life sciences)  . Smokeless tobacco: Never Used  Vaping Use  . Vaping Use: Never used   Substance and Sexual Activity  . Alcohol use: Yes    Comment: occ.  . Drug use: Never  . Sexual activity: Not on file  Other Topics Concern  . Not on file  Social History Narrative  . Not on file   Social Determinants of Health   Financial Resource Strain: Not on file  Food Insecurity: Not on file  Transportation Needs: Not on file  Physical Activity: Not on file  Stress: Not on file  Social Connections: Not on file  Intimate Partner Violence: Not on file     Current Outpatient Medications:  .  acetaminophen (TYLENOL) 500 MG tablet, Take 500 mg by mouth every 6 (six) hours as needed for mild pain or moderate pain., Disp: , Rfl:  .  aspirin EC 81 MG tablet, Take by mouth., Disp: , Rfl:  .  atorvastatin (LIPITOR) 40 MG tablet, TAKE 1 TABLET BY MOUTH ONCE DAILY FOR CHOLESTEROL, Disp: 30 tablet, Rfl: 0 .  loratadine (CLARITIN) 10 MG tablet, Take 10 mg by mouth every evening. , Disp: , Rfl: 6 .  metoprolol tartrate (LOPRESSOR) 25 MG tablet, Take 1 tablet by mouth every 12 (twelve) hours., Disp: , Rfl: 6   No Known Allergies  ROS Review of Systems  Constitutional: Negative.   HENT: Negative.  Eyes: Negative.   Respiratory: Negative.  Negative for chest tightness, shortness of breath and wheezing.   Cardiovascular: Negative.  Negative for chest pain.  Gastrointestinal: Negative.  Negative for abdominal pain and blood in stool.  Endocrine: Negative.  Negative for polydipsia.  Genitourinary: Negative.  Negative for hematuria.  Musculoskeletal: Negative.   Skin: Negative.   Allergic/Immunologic: Negative.   Neurological: Negative.  Negative for dizziness and light-headedness.  Hematological: Negative.   Psychiatric/Behavioral: Negative.  Negative for hallucinations.  All other systems reviewed and are negative.     Objective:    Physical Exam Vitals reviewed.  Constitutional:      Appearance: Normal appearance.  HENT:     Mouth/Throat:     Mouth: Mucous membranes  are moist.  Eyes:     Pupils: Pupils are equal, round, and reactive to light.  Neck:     Vascular: No carotid bruit.  Cardiovascular:     Rate and Rhythm: Normal rate and regular rhythm.     Pulses: Normal pulses.     Heart sounds: Normal heart sounds.  Pulmonary:     Effort: Pulmonary effort is normal.     Breath sounds: Normal breath sounds.  Abdominal:     General: Bowel sounds are normal.     Palpations: Abdomen is soft. There is no hepatomegaly, splenomegaly or mass.     Tenderness: There is no abdominal tenderness.     Hernia: No hernia is present.  Musculoskeletal:     Cervical back: Neck supple.     Right lower leg: No edema.     Left lower leg: No edema.  Skin:    Findings: No rash.  Neurological:     Mental Status: He is alert and oriented to person, place, and time.     Motor: No weakness.     Comments: Patient has shaking tremors of the both hands and some cogwheel rigidity suggestive of parkinsonian disease.  Psychiatric:        Mood and Affect: Mood normal.        Behavior: Behavior normal.     BP 136/84   Pulse 63   Ht 5\' 5"  (1.651 m)   Wt 244 lb 12.8 oz (111 kg)   BMI 40.74 kg/m  Wt Readings from Last 3 Encounters:  11/10/20 244 lb 12.8 oz (111 kg)  11/18/19 240 lb (108.9 kg)  05/01/18 240 lb (108.9 kg)     Health Maintenance Due  Topic Date Due  . Hepatitis C Screening  Never done  . COVID-19 Vaccine (1) Never done  . TETANUS/TDAP  Never done  . PNA vac Low Risk Adult (1 of 2 - PCV13) Never done  . INFLUENZA VACCINE  Never done    There are no preventive care reminders to display for this patient.  Lab Results  Component Value Date   TSH 4.68 (H) 11/14/2020   Lab Results  Component Value Date   WBC 4.9 11/14/2020   HGB 15.4 11/14/2020   HCT 45.4 11/14/2020   MCV 88.3 11/14/2020   PLT 153 11/14/2020   Lab Results  Component Value Date   NA 145 11/14/2020   K 4.6 11/14/2020   CO2 22 11/14/2020   GLUCOSE 95 11/14/2020   BUN 15  11/14/2020   CREATININE 1.21 11/14/2020   BILITOT 0.4 11/14/2020   ALKPHOS 64 11/24/2019   AST 29 11/14/2020   ALT 27 11/14/2020   PROT 7.1 11/14/2020   ALBUMIN 2.9 (L) 11/24/2019   CALCIUM  9.6 11/14/2020   ANIONGAP 11 11/24/2019   Lab Results  Component Value Date   CHOL 164 11/14/2020   Lab Results  Component Value Date   HDL 61 11/14/2020   Lab Results  Component Value Date   LDLCALC 89 11/14/2020   Lab Results  Component Value Date   TRIG 54 11/14/2020   Lab Results  Component Value Date   CHOLHDL 2.7 11/14/2020   Lab Results  Component Value Date   HGBA1C 6.6 (H) 11/22/2019      Assessment & Plan:   Problem List Items Addressed This Visit      Cardiovascular and Mediastinum   Essential hypertension    - Today, the patient's blood pressure is well managed on betablockeRs . - The patient will continue  the current treatment regimen.  - I encouraged the patient to eat a low-sodium diet to help control blood pressure. - I encouraged the patient to live an active lifestyle and complete activities that increases heart rate to 85% target heart rate at least 5 times per week for one hour.          Coronary artery disease, non-occlusive    Patient denies any chest pain or shortness of breath.  Chest is clear heart is regular there is no pedal edema he has shaking tremors of the both hands        Respiratory   Seasonal allergic rhinitis due to pollen    Patient was advised to take Claritin 5 mg as needed        Other   Heartburn - Primary    - The patient's GERD is stable on medication.  - Instructed the patient to avoid eating spicy and acidic foods, as well as foods high in fat. - Instructed the patient to avoid eating large meals or meals 2-3 hours prior to sleeping.       Annual physical exam    Patient was advised to get a Covid shot.  #2 he was advised to lose weight.  #3 his heart is regular chest is clear abdominal soft nontender there is no  pedal edema.  Liver enzymes are slightly elevated so advised to get it repeated.  His weight today is 244 pound and he was advised to lose at least 40 pounds of weight.         No orders of the defined types were placed in this encounter.   Follow-up: No follow-ups on file.    Cletis Athens, MD

## 2020-11-14 ENCOUNTER — Other Ambulatory Visit (INDEPENDENT_AMBULATORY_CARE_PROVIDER_SITE_OTHER): Payer: Medicare Other

## 2020-11-14 DIAGNOSIS — I10 Essential (primary) hypertension: Secondary | ICD-10-CM

## 2020-11-14 DIAGNOSIS — E785 Hyperlipidemia, unspecified: Secondary | ICD-10-CM

## 2020-11-15 LAB — CBC WITH DIFFERENTIAL/PLATELET
Absolute Monocytes: 510 cells/uL (ref 200–950)
Basophils Absolute: 39 cells/uL (ref 0–200)
Basophils Relative: 0.8 %
Eosinophils Absolute: 142 cells/uL (ref 15–500)
Eosinophils Relative: 2.9 %
HCT: 45.4 % (ref 38.5–50.0)
Hemoglobin: 15.4 g/dL (ref 13.2–17.1)
Lymphs Abs: 2092 cells/uL (ref 850–3900)
MCH: 30 pg (ref 27.0–33.0)
MCHC: 33.9 g/dL (ref 32.0–36.0)
MCV: 88.3 fL (ref 80.0–100.0)
MPV: 10.9 fL (ref 7.5–12.5)
Monocytes Relative: 10.4 %
Neutro Abs: 2117 cells/uL (ref 1500–7800)
Neutrophils Relative %: 43.2 %
Platelets: 153 10*3/uL (ref 140–400)
RBC: 5.14 10*6/uL (ref 4.20–5.80)
RDW: 12.8 % (ref 11.0–15.0)
Total Lymphocyte: 42.7 %
WBC: 4.9 10*3/uL (ref 3.8–10.8)

## 2020-11-15 LAB — COMPLETE METABOLIC PANEL WITH GFR
AG Ratio: 1.4 (calc) (ref 1.0–2.5)
ALT: 27 U/L (ref 9–46)
AST: 29 U/L (ref 10–35)
Albumin: 4.2 g/dL (ref 3.6–5.1)
Alkaline phosphatase (APISO): 99 U/L (ref 35–144)
BUN: 15 mg/dL (ref 7–25)
CO2: 22 mmol/L (ref 20–32)
Calcium: 9.6 mg/dL (ref 8.6–10.3)
Chloride: 108 mmol/L (ref 98–110)
Creat: 1.21 mg/dL (ref 0.70–1.25)
GFR, Est African American: 72 mL/min/{1.73_m2} (ref >=60)
GFR, Est Non African American: 62 mL/min/{1.73_m2} (ref >=60)
Globulin: 2.9 g/dL (calc) (ref 1.9–3.7)
Glucose, Bld: 95 mg/dL (ref 65–99)
Potassium: 4.6 mmol/L (ref 3.5–5.3)
Sodium: 145 mmol/L (ref 135–146)
Total Bilirubin: 0.4 mg/dL (ref 0.2–1.2)
Total Protein: 7.1 g/dL (ref 6.1–8.1)

## 2020-11-15 LAB — LIPID PANEL
Cholesterol: 164 mg/dL (ref <200)
HDL: 61 mg/dL (ref >=40)
LDL Cholesterol (Calc): 89 mg/dL (calc)
Non-HDL Cholesterol (Calc): 103 mg/dL (calc) (ref <130)
Total CHOL/HDL Ratio: 2.7 (calc) (ref <5.0)
Triglycerides: 54 mg/dL (ref <150)

## 2020-11-15 LAB — TSH: TSH: 4.68 mIU/L — ABNORMAL HIGH (ref 0.40–4.50)

## 2020-11-19 ENCOUNTER — Encounter: Payer: Self-pay | Admitting: Internal Medicine

## 2020-11-19 NOTE — Assessment & Plan Note (Signed)
-   The patient's GERD is stable on medication.  - Instructed the patient to avoid eating spicy and acidic foods, as well as foods high in fat. - Instructed the patient to avoid eating large meals or meals 2-3 hours prior to sleeping. 

## 2020-11-19 NOTE — Assessment & Plan Note (Signed)
Patient denies any chest pain or shortness of breath.  Chest is clear heart is regular there is no pedal edema he has shaking tremors of the both hands

## 2020-11-19 NOTE — Assessment & Plan Note (Signed)
Patient was advised to get a Covid shot.  #2 he was advised to lose weight.  #3 his heart is regular chest is clear abdominal soft nontender there is no pedal edema.  Liver enzymes are slightly elevated so advised to get it repeated.  His weight today is 244 pound and he was advised to lose at least 40 pounds of weight.

## 2020-11-19 NOTE — Assessment & Plan Note (Signed)
-   Today, the patient's blood pressure is well managed on betablockeRs . - The patient will continue  the current treatment regimen.  - I encouraged the patient to eat a low-sodium diet to help control blood pressure. - I encouraged the patient to live an active lifestyle and complete activities that increases heart rate to 85% target heart rate at least 5 times per week for one hour.

## 2020-11-19 NOTE — Assessment & Plan Note (Signed)
Patient was advised to take Claritin 5 mg as needed

## 2020-11-21 ENCOUNTER — Other Ambulatory Visit: Payer: Self-pay

## 2020-11-21 ENCOUNTER — Ambulatory Visit (INDEPENDENT_AMBULATORY_CARE_PROVIDER_SITE_OTHER): Payer: Medicare Other | Admitting: Internal Medicine

## 2020-11-21 ENCOUNTER — Encounter: Payer: Self-pay | Admitting: Internal Medicine

## 2020-11-21 VITALS — BP 127/85 | HR 70 | Ht 65.0 in | Wt 244.8 lb

## 2020-11-21 DIAGNOSIS — E8881 Metabolic syndrome: Secondary | ICD-10-CM

## 2020-11-21 DIAGNOSIS — N5201 Erectile dysfunction due to arterial insufficiency: Secondary | ICD-10-CM

## 2020-11-21 DIAGNOSIS — E063 Autoimmune thyroiditis: Secondary | ICD-10-CM

## 2020-11-21 DIAGNOSIS — I2510 Atherosclerotic heart disease of native coronary artery without angina pectoris: Secondary | ICD-10-CM | POA: Diagnosis not present

## 2020-11-21 DIAGNOSIS — E038 Other specified hypothyroidism: Secondary | ICD-10-CM | POA: Diagnosis not present

## 2020-11-21 DIAGNOSIS — I10 Essential (primary) hypertension: Secondary | ICD-10-CM | POA: Diagnosis not present

## 2020-11-21 MED ORDER — LEVOTHYROXINE SODIUM 50 MCG PO TABS: 50.0000 ug | ORAL_TABLET | Freq: Every day | ORAL | 3 refills | Status: DC

## 2020-11-21 MED ORDER — SILDENAFIL CITRATE 100 MG PO TABS: 50.0000 mg | ORAL_TABLET | Freq: Every day | ORAL | 11 refills | Status: DC | PRN

## 2020-11-21 NOTE — Assessment & Plan Note (Signed)
We will start the patient on Viagra.  He can use it as needed.

## 2020-11-21 NOTE — Progress Notes (Signed)
Established Patient Office Visit  Subjective:  Patient ID: Thomas Hood, male    DOB: Feb 28, 1956  Age: 64 y.o. MRN: [id number]  CC:  Chief Complaint  Patient presents with  . lab results    HPI  RISHAWN Hood presents for parkisonian disease  , c/o trmors of hands, no trouble walking   Past Medical History:  Diagnosis Date  . Arthritis   . Atherosclerosis   . CAD (coronary artery disease)   . Dizziness   . HTN (hypertension)   . Mitral valve prolapse     Past Surgical History:  Procedure Laterality Date  . CHOLESTEATOMA EXCISION Right   . COLONOSCOPY     3-4 yrs. ago  Hx polyps  . COLONOSCOPY WITH PROPOFOL N/A 05/01/2018   Procedure: COLONOSCOPY WITH PROPOFOL;  Surgeon: Virgel Manifold, MD;  Location: [facility] ENDOSCOPY;  Service: Endoscopy;  Laterality: N/A;  . CORONARY ARTERY BYPASS GRAFT    . ESOPHAGOGASTRODUODENOSCOPY (EGD) WITH PROPOFOL N/A 05/01/2018   Procedure: ESOPHAGOGASTRODUODENOSCOPY (EGD) WITH PROPOFOL;  Surgeon: Virgel Manifold, MD;  Location: [facility] ENDOSCOPY;  Service: Endoscopy;  Laterality: N/A;  . TYMPANOPLASTY W/ MASTOIDECTOMY  08/23/2015    Family History  Problem Relation Age of Onset  . Cancer Mother        colon  . Cancer Sister        lung cancer-hx smoking  . Heart failure Sister     Social History   Socioeconomic History  . Marital status: Single    Spouse name: Not on file  . Number of children: Not on file  . Years of education: Not on file  . Highest education level: Not on file  Occupational History  . Not on file  Tobacco Use  . Smoking status: Former Research scientist (life sciences)  . Smokeless tobacco: Never Used  Vaping Use  . Vaping Use: Never used  Substance and Sexual Activity  . Alcohol use: Yes    Comment: occ.  . Drug use: Never  . Sexual activity: Not on file  Other Topics Concern  . Not on file  Social History Narrative  . Not on file   Social Determinants of Health   Financial Resource Strain: Not on file  Food  Insecurity: Not on file  Transportation Needs: Not on file  Physical Activity: Not on file  Stress: Not on file  Social Connections: Not on file  Intimate Partner Violence: Not on file     Current Outpatient Medications:  .  acetaminophen (TYLENOL) 500 MG tablet, Take 500 mg by mouth every 6 (six) hours as needed for mild pain or moderate pain., Disp: , Rfl:  .  aspirin EC 81 MG tablet, Take by mouth., Disp: , Rfl:  .  atorvastatin (LIPITOR) 40 MG tablet, TAKE 1 TABLET BY MOUTH ONCE DAILY FOR CHOLESTEROL, Disp: 30 tablet, Rfl: 0 .  loratadine (CLARITIN) 10 MG tablet, Take 10 mg by mouth every evening. , Disp: , Rfl: 6 .  metoprolol tartrate (LOPRESSOR) 25 MG tablet, Take 1 tablet by mouth every 12 (twelve) hours., Disp: , Rfl: 6 .  levothyroxine (SYNTHROID) 50 MCG tablet, Take 1 tablet (50 mcg total) by mouth daily., Disp: 90 tablet, Rfl: 3 .  sildenafil (VIAGRA) 100 MG tablet, Take 0.5-1 tablets (50-100 mg total) by mouth daily as needed for erectile dysfunction., Disp: 5 tablet, Rfl: 11   No Known Allergies  ROS Review of Systems  Constitutional: Negative.   HENT: Negative.   Eyes: Negative.   Respiratory:  Negative.   Cardiovascular: Negative.   Gastrointestinal: Negative.   Endocrine: Negative.   Genitourinary: Negative.   Musculoskeletal: Negative.   Skin: Negative.   Allergic/Immunologic: Negative.   Neurological: Negative.   Hematological: Negative.   Psychiatric/Behavioral: Negative.   All other systems reviewed and are negative.     Objective:    Physical Exam Vitals reviewed.  Constitutional:      Appearance: Normal appearance.  HENT:     Mouth/Throat:     Mouth: Mucous membranes are moist.  Eyes:     Pupils: Pupils are equal, round, and reactive to light.  Neck:     Vascular: No carotid bruit.  Cardiovascular:     Rate and Rhythm: Normal rate and regular rhythm.     Pulses: Normal pulses.     Heart sounds: Normal heart sounds.  Pulmonary:      Effort: Pulmonary effort is normal.     Breath sounds: Normal breath sounds.  Abdominal:     General: Bowel sounds are normal.     Palpations: Abdomen is soft. There is no hepatomegaly, splenomegaly or mass.     Tenderness: There is no abdominal tenderness.     Hernia: No hernia is present.  Musculoskeletal:     Cervical back: Neck supple.     Right lower leg: No edema.     Left lower leg: No edema.  Skin:    Findings: No rash.  Neurological:     Mental Status: He is alert and oriented to person, place, and time.     Motor: No weakness.  Psychiatric:        Mood and Affect: Mood normal.        Behavior: Behavior normal.     BP 127/85   Pulse 70   Ht 5\' 5"  (1.651 m)   Wt 244 lb 12.8 oz (111 kg)   BMI 40.74 kg/m  Wt Readings from Last 3 Encounters:  11/21/20 244 lb 12.8 oz (111 kg)  11/10/20 244 lb 12.8 oz (111 kg)  11/18/19 240 lb (108.9 kg)     Health Maintenance Due  Topic Date Due  . Hepatitis C Screening  Never done  . COVID-19 Vaccine (1) Never done  . TETANUS/TDAP  Never done  . PNA vac Low Risk Adult (1 of 2 - PCV13) Never done  . INFLUENZA VACCINE  Never done    There are no preventive care reminders to display for this patient.  Lab Results  Component Value Date   TSH 4.68 (H) 11/14/2020   Lab Results  Component Value Date   WBC 4.9 11/14/2020   HGB 15.4 11/14/2020   HCT 45.4 11/14/2020   MCV 88.3 11/14/2020   PLT 153 11/14/2020   Lab Results  Component Value Date   NA 145 11/14/2020   K 4.6 11/14/2020   CO2 22 11/14/2020   GLUCOSE 95 11/14/2020   BUN 15 11/14/2020   CREATININE 1.21 11/14/2020   BILITOT 0.4 11/14/2020   ALKPHOS 64 11/24/2019   AST 29 11/14/2020   ALT 27 11/14/2020   PROT 7.1 11/14/2020   ALBUMIN 2.9 (L) 11/24/2019   CALCIUM 9.6 11/14/2020   ANIONGAP 11 11/24/2019   Lab Results  Component Value Date   CHOL 164 11/14/2020   Lab Results  Component Value Date   HDL 61 11/14/2020   Lab Results  Component Value  Date   LDLCALC 89 11/14/2020   Lab Results  Component Value Date   TRIG 54 11/14/2020  Lab Results  Component Value Date   CHOLHDL 2.7 11/14/2020   Lab Results  Component Value Date   HGBA1C 6.6 (H) 11/22/2019      Assessment & Plan:   Problem List Items Addressed This Visit      Cardiovascular and Mediastinum   Essential hypertension    - Today, the patient's blood pressure is well managed on present treatment. - The patient will continue the current treatment regimen.  - I encouraged the patient to eat a low-sodium diet to help control blood pressure. - I encouraged the patient to live an active lifestyle and complete activities that increases heart rate to 85% target heart rate at least 5 times per week for one hour.          Relevant Medications   sildenafil (VIAGRA) 100 MG tablet   Erectile dysfunction due to arterial insufficiency - Primary    We will start the patient on Viagra.  He can use it as needed.      Relevant Medications   sildenafil (VIAGRA) 100 MG tablet     Endocrine   Hypothyroidism due to Hashimoto's thyroiditis    Patient was started on levothyroxine 50 mcg p.o. daily      Relevant Medications   levothyroxine (SYNTHROID) 50 MCG tablet     Other   Metabolic syndrome    - I encouraged the patient to lose weight.  - I educated them on making healthy dietary choices including eating more fruits and vegetables and less fried foods. - I encouraged the patient to exercise more, and educated on the benefits of exercise including weight loss, diabetes prevention, and hypertension prevention.           Meds ordered this encounter  Medications  . sildenafil (VIAGRA) 100 MG tablet    Sig: Take 0.5-1 tablets (50-100 mg total) by mouth daily as needed for erectile dysfunction.    Dispense:  5 tablet    Refill:  11  . levothyroxine (SYNTHROID) 50 MCG tablet    Sig: Take 1 tablet (50 mcg total) by mouth daily.    Dispense:  90 tablet     Refill:  3    Follow-up: No follow-ups on file.    Cletis Athens, MD

## 2020-11-21 NOTE — Assessment & Plan Note (Signed)
-   Today, the patient's blood pressure is well managed on present  treatment. - The patient will continue the current treatment regimen.  - I encouraged the patient to eat a low-sodium diet to help control blood pressure. - I encouraged the patient to live an active lifestyle and complete activities that increases heart rate to 85% target heart rate at least 5 times per week for one hour.     

## 2020-11-21 NOTE — Assessment & Plan Note (Signed)
-   I encouraged the patient to lose weight.  - I educated them on making healthy dietary choices including eating more fruits and vegetables and less fried foods. - I encouraged the patient to exercise more, and educated on the benefits of exercise including weight loss, diabetes prevention, and hypertension prevention.   

## 2020-11-21 NOTE — Assessment & Plan Note (Signed)
Patient was started on levothyroxine 50 mcg p.o. daily

## 2020-12-01 ENCOUNTER — Other Ambulatory Visit: Payer: Self-pay | Admitting: Internal Medicine

## 2020-12-09 DIAGNOSIS — Z23 Encounter for immunization: Secondary | ICD-10-CM | POA: Diagnosis not present

## 2020-12-27 ENCOUNTER — Ambulatory Visit: Payer: Medicare Other | Admitting: Internal Medicine

## 2021-02-08 ENCOUNTER — Other Ambulatory Visit: Payer: Self-pay | Admitting: Internal Medicine

## 2021-07-20 ENCOUNTER — Other Ambulatory Visit: Payer: Self-pay | Admitting: Internal Medicine

## 2021-10-16 ENCOUNTER — Ambulatory Visit: Payer: Medicare HMO

## 2021-11-06 ENCOUNTER — Ambulatory Visit (INDEPENDENT_AMBULATORY_CARE_PROVIDER_SITE_OTHER): Payer: Medicare HMO

## 2021-11-06 DIAGNOSIS — Z23 Encounter for immunization: Secondary | ICD-10-CM

## 2021-11-10 ENCOUNTER — Ambulatory Visit (INDEPENDENT_AMBULATORY_CARE_PROVIDER_SITE_OTHER): Payer: Medicare HMO | Admitting: *Deleted

## 2021-11-10 DIAGNOSIS — Z122 Encounter for screening for malignant neoplasm of respiratory organs: Secondary | ICD-10-CM

## 2021-11-10 DIAGNOSIS — Z0000 Encounter for general adult medical examination without abnormal findings: Secondary | ICD-10-CM

## 2021-11-10 NOTE — Progress Notes (Signed)
Subjective:   Thomas Hood is a 65 y.o. male who presents for Medicare Annual/Subsequent preventive examination.  I discussed the limitations of evaluation and management by telemedicine and the availability of in person appointments. Patient expressed understanding and agreed to proceed.   Visit performed using audio  Patient:home Provider:home   These are the goals we discussed:  Goals   None     This is a list of the screening recommended for you and due dates:  Health Maintenance  Topic Date Due   Hepatitis C Screening: USPSTF Recommendation to screen - Ages 73-79 yo.  Never done   Flu Shot  07/10/2021   COVID-19 Vaccine (4 - Booster for Moderna series) 11/26/2021*   Zoster (Shingles) Vaccine (1 of 2) 02/08/2022*   Colon Cancer Screening  05/01/2028   Tetanus Vaccine  11/07/2031   Pneumonia Vaccine  Completed   HPV Vaccine  Aged Out  *Topic was postponed. The date shown is not the original due date.     Review of Systems    Defer to provider  Cardiac Risk Factors include: advanced age (>35men, >66 women);male gender     Objective:    There were no vitals filed for this visit. There is no height or weight on file to calculate BMI.  Advanced Directives 11/10/2021 11/20/2019 05/01/2018  Does Patient Have a Medical Advance Directive? No No No  Would patient like information on creating a medical advance directive? No - Patient declined No - Patient declined No - Patient declined    Current Medications (verified) Outpatient Encounter Medications as of 11/10/2021  Medication Sig   acetaminophen (TYLENOL) 500 MG tablet Take 500 mg by mouth every 6 (six) hours as needed for mild pain or moderate pain.   aspirin EC 81 MG tablet Take by mouth.   atorvastatin (LIPITOR) 40 MG tablet TAKE 1 TABLET BY MOUTH ONCE DAILY FOR CHOLESTEROL   levothyroxine (SYNTHROID) 50 MCG tablet Take 1 tablet (50 mcg total) by mouth daily.   loratadine (CLARITIN) 10 MG tablet Take 10 mg by  mouth every evening.    metoprolol tartrate (LOPRESSOR) 25 MG tablet TAKE (1) TABLET BY MOUTH EVERY TWELVE HOURS.   sildenafil (VIAGRA) 100 MG tablet Take 0.5-1 tablets (50-100 mg total) by mouth daily as needed for erectile dysfunction.   No facility-administered encounter medications on file as of 11/10/2021.    Allergies (verified) Patient has no known allergies.   History: Past Medical History:  Diagnosis Date   Arthritis    Atherosclerosis    CAD (coronary artery disease)    Dizziness    HTN (hypertension)    Mitral valve prolapse    Past Surgical History:  Procedure Laterality Date   CHOLESTEATOMA EXCISION Right    COLONOSCOPY     3-4 yrs. ago  Hx polyps   COLONOSCOPY WITH PROPOFOL N/A 05/01/2018   Procedure: COLONOSCOPY WITH PROPOFOL;  Surgeon: Virgel Manifold, MD;  Location: [facility] END[facility]COPY;  Service: Endoscopy;  Laterality: N/A;   CORONARY ARTERY BYPASS GRAFT     ESOPHAGOGASTRODUODENOSCOPY (EGD) WITH PROPOFOL N/A 05/01/2018   Procedure: ESOPHAGOGASTRODUODENOSCOPY (EGD) WITH PROPOFOL;  Surgeon: Virgel Manifold, MD;  Location: [facility] END[facility];  Service: Endoscopy;  Laterality: N/A;   TYMPANOPLASTY W/ MASTOIDECTOMY  08/23/2015   Family History  Problem Relation Age of Onset   Cancer Mother        colon   Cancer Sister        lung cancer-hx smoking   Heart failure Sister  Social History   Socioeconomic History   Marital status: Single    Spouse name: Not on file   Number of children: Not on file   Years of education: Not on file   Highest education level: Not on file  Occupational History   Not on file  Tobacco Use   Smoking status: Former   Smokeless tobacco: Never  Vaping Use   Vaping Use: Never used  Substance and Sexual Activity   Alcohol use: Yes    Comment: occ.   Drug use: Never   Sexual activity: Not on file  Other Topics Concern   Not on file  Social History Narrative   Not on file   Social Determinants of Health   Financial  Resource Strain: Low Risk    Difficulty of Paying Living Expenses: Not hard at all  Food Insecurity: No Food Insecurity   Worried About Running Out of Food in the Last Year: Never true   [place] in the Last Year: Never true  Transportation Needs: No Transportation Needs   Lack of Transportation (Medical): No   Lack of Transportation (Non-Medical): No  Physical Activity: Sufficiently Active   Days of Exercise per Week: 4 days   Minutes of Exercise per Session: 40 min  Stress: No Stress Concern Present   Feeling of Stress : Not at all  Social Connections: Moderately Integrated   Frequency of Communication with Friends and Family: More than three times a week   Frequency of Social Gatherings with Friends and Family: More than three times a week   Attends Religious Services: Never   Marine scientist or Organizations: No   Attends Music therapist: More than 4 times per year   Marital Status: Married    Tobacco Counseling Counseling given: Not Answered   Clinical Intake:  Pre-visit preparation completed: No  Pain : No/denies pain     Nutritional Risks: None  How often do you need to have someone help you when you read instructions, pamphlets, or other written materials from your doctor or pharmacy?: 1 - Never What is the last grade level you completed in school?: 9  Diabetic?No  Interpreter Needed?: No  Information entered by :: Lacretia Nicks, CMA   Activities of Daily Living In your present state of health, do you have any difficulty performing the following activities: 11/10/2021  Hearing? N  Vision? N  Difficulty concentrating or making decisions? N  Walking or climbing stairs? N  Dressing or bathing? N  Doing errands, shopping? N  Preparing Food and eating ? N  Using the Toilet? N  In the past six months, have you accidently leaked urine? N  Do you have problems with loss of bowel control? N  Managing your Medications? N  Managing  your Finances? N  Housekeeping or managing your Housekeeping? N  Some recent data might be hidden    Patient Care Team: Cletis Athens, MD as PCP - General (Internal Medicine)  Indicate any recent Medical Services you may have received from other than [facility] providers in the past year (date may be approximate).     Assessment:   This is a routine wellness examination for Thomas Hood.  Hearing/Vision screen No results found.  Dietary issues and exercise activities discussed: Current Exercise Habits: Home exercise routine, Type of exercise: strength training/weights;walking, Time (Minutes): 40, Frequency (Times/Week): 4, Weekly Exercise (Minutes/Week): 160, Intensity: Mild, Exercise limited by: None identified   Goals Addressed   None  Depression Screen PHQ 2/9 Scores 11/10/2021 11/10/2020  PHQ - 2 Score 0 0    Fall Risk Fall Risk  11/10/2021 11/10/2020  Falls in the past year? 0 0  Number falls in past yr: 0 0  Injury with Fall? 0 0  Risk for fall due to : No Fall Risks -  Follow up Falls evaluation completed -    FALL RISK PREVENTION PERTAINING TO THE HOME:  Any stairs in or around the home? No  If so, are there any without handrails? No  Home free of loose throw rugs in walkways, pet beds, electrical cords, etc? Yes  Adequate lighting in your home to reduce risk of falls? Yes   ASSISTIVE DEVICES UTILIZED TO PREVENT FALLS:  Life alert? No  Use of a cane, walker or w/c? No  Grab bars in the bathroom? No  Shower chair or bench in shower? No  Elevated toilet seat or a handicapped toilet? No   TIMED UP AND GO: Was the test performed? No    Gait steady and fast without use of assistive device  Cognitive Function: MMSE - Mini Mental State Exam 11/10/2021  Not completed: Unable to complete     6CIT Screen 11/10/2021  What Year? 0 points  What month? 0 points  What time? 0 points  Count back from 20 0 points  Months in reverse 0 points  Repeat phrase 0 points  Total  Score 0    Immunizations Immunization History  Administered Date(s) Administered   Influenza-Unspecified 12/30/2020   Moderna Sars-Covid-2 Vaccination 02/11/2020, 03/10/2020, 12/09/2020   PNEUMOCOCCAL CONJUGATE-20 11/06/2021   Tdap 11/06/2021    TDAP status: Up to date  Flu Vaccine status: Due, Education has been provided regarding the importance of this vaccine. Advised may receive this vaccine at local pharmacy or Health Dept. Aware to provide a copy of the vaccination record if obtained from local pharmacy or Health Dept. Verbalized acceptance and understanding.  Pneumococcal vaccine status: Up to date  Covid-19 vaccine status: Declined, Education has been provided regarding the importance of this vaccine but patient still declined. Advised may receive this vaccine at local pharmacy or Health Dept.or vaccine clinic. Aware to provide a copy of the vaccination record if obtained from local pharmacy or Health Dept. Verbalized acceptance and understanding.  Qualifies for Shingles Vaccine? No   Zostavax completed No   Shingrix Completed?: No.    Education has been provided regarding the importance of this vaccine. Patient has been advised to call insurance company to determine out of pocket expense if they have not yet received this vaccine. Advised may also receive vaccine at local pharmacy or Health Dept. Verbalized acceptance and understanding.  Screening Tests Health Maintenance  Topic Date Due   Hepatitis C Screening  Never done   INFLUENZA VACCINE  07/10/2021   COVID-19 Vaccine (4 - Booster for Moderna series) 11/26/2021 (Originally 02/03/2021)   Zoster Vaccines- Shingrix (1 of 2) 02/08/2022 (Originally 11/15/1975)   COLONOSCOPY (Pts 45-53yrs Insurance coverage will need to be confirmed)  05/01/2028   TETANUS/TDAP  11/07/2031   Pneumonia Vaccine 53+ Years old  Completed   HPV VACCINES  Aged Out    Health Maintenance  Health Maintenance Due  Topic Date Due   Hepatitis C  Screening  Never done   INFLUENZA VACCINE  07/10/2021    Colorectal cancer screening: Type of screening: Colonoscopy. Completed 05/01/2018. Repeat every 10 years Lung Cancer Screening: (Low Dose CT Chest recommended if Age 53-80 years, 30 pack-year  currently smoking OR have quit w/in 15years.) does qualify.   Lung Cancer Screening Referral: order today   Additional Screening:  Hepatitis C Screening: does qualify; will complete at next labs draw   Vision Screening: Recommended annual ophthalmology exams for early detection of glaucoma and other disorders of the eye. Is the patient up to date with their annual eye exam?  Yes  Who is the provider or what is the name of the office in which the patient attends annual eye exams? [place]  If pt is not established with a provider, would they like to be referred to a provider to establish care? No .   Dental Screening: Recommended annual dental exams for proper oral hygiene  Community Resource Referral / Chronic Care Management: CRR required this visit?  No   CCM required this visit?  No      Plan:     I have personally reviewed and noted the following in the patient's chart:   Medical and social history Use of alcohol, tobacco or illicit drugs  Current medications and supplements including opioid prescriptions. Patient is not currently taking opioid prescriptions. Functional ability and status Nutritional status Physical activity Advanced directives List of other physicians Hospitalizations, surgeries, and ER visits in previous 12 months Vitals Screenings to include cognitive, depression, and falls Referrals and appointments  In addition, I have reviewed and discussed with patient certain preventive protocols, quality metrics, and best practice recommendations. A written personalized care plan for preventive services as well as general preventive health recommendations were provided to patient.     Lacretia Nicks,  [place]   11/10/2021   Nurse Notes:  Thomas Hood , Thank you for taking time to come for your Medicare Wellness Visit. I appreciate your ongoing commitment to your health goals. Please review the following plan we discussed and let me know if I can assist you in the future.   These are the goals we discussed:  Goals   None     This is a list of the screening recommended for you and due dates:  Health Maintenance  Topic Date Due   Hepatitis C Screening: USPSTF Recommendation to screen - Ages 38-79 yo.  Never done   Flu Shot  07/10/2021   COVID-19 Vaccine (4 - Booster for Moderna series) 11/26/2021*   Zoster (Shingles) Vaccine (1 of 2) 02/08/2022*   Colon Cancer Screening  05/01/2028   Tetanus Vaccine  11/07/2031   Pneumonia Vaccine  Completed   HPV Vaccine  Aged Out  *Topic was postponed. The date shown is not the original due date.    Time spent 40 min

## 2021-11-15 NOTE — Progress Notes (Signed)
I have reviewed this visit and agree with the documentation.   

## 2021-11-23 NOTE — Progress Notes (Signed)
Vaccines given, pt was cleared by [place] [facility] allergy clinic to have vaccines

## 2021-11-29 ENCOUNTER — Other Ambulatory Visit: Payer: Self-pay | Admitting: Internal Medicine

## 2021-11-29 DIAGNOSIS — E063 Autoimmune thyroiditis: Secondary | ICD-10-CM

## 2021-12-06 ENCOUNTER — Other Ambulatory Visit: Payer: Self-pay | Admitting: Internal Medicine

## 2021-12-08 ENCOUNTER — Other Ambulatory Visit: Payer: Self-pay | Admitting: Internal Medicine

## 2022-01-09 ENCOUNTER — Other Ambulatory Visit: Payer: Self-pay | Admitting: Internal Medicine

## 2022-04-02 ENCOUNTER — Ambulatory Visit (INDEPENDENT_AMBULATORY_CARE_PROVIDER_SITE_OTHER): Payer: Medicare HMO | Admitting: Internal Medicine

## 2022-04-02 ENCOUNTER — Encounter: Payer: Self-pay | Admitting: Internal Medicine

## 2022-04-02 VITALS — BP 140/87 | HR 57 | Ht 65.0 in | Wt 247.1 lb

## 2022-04-02 DIAGNOSIS — E8881 Metabolic syndrome: Secondary | ICD-10-CM

## 2022-04-02 DIAGNOSIS — I2510 Atherosclerotic heart disease of native coronary artery without angina pectoris: Secondary | ICD-10-CM | POA: Diagnosis not present

## 2022-04-02 DIAGNOSIS — K2100 Gastro-esophageal reflux disease with esophagitis, without bleeding: Secondary | ICD-10-CM

## 2022-04-02 DIAGNOSIS — J301 Allergic rhinitis due to pollen: Secondary | ICD-10-CM | POA: Diagnosis not present

## 2022-04-02 DIAGNOSIS — I10 Essential (primary) hypertension: Secondary | ICD-10-CM | POA: Diagnosis not present

## 2022-04-02 LAB — CBC WITH DIFFERENTIAL/PLATELET
Absolute Monocytes: 604 cells/uL (ref 200–950)
Basophils Absolute: 51 cells/uL (ref 0–200)
Basophils Relative: 0.9 %
Eosinophils Absolute: 103 cells/uL (ref 15–500)
Eosinophils Relative: 1.8 %
HCT: 46.5 % (ref 38.5–50.0)
Hemoglobin: 15.7 g/dL (ref 13.2–17.1)
Lymphs Abs: 2742 cells/uL (ref 850–3900)
MCH: 30.7 pg (ref 27.0–33.0)
MCHC: 33.8 g/dL (ref 32.0–36.0)
MCV: 91 fL (ref 80.0–100.0)
MPV: 11.4 fL (ref 7.5–12.5)
Monocytes Relative: 10.6 %
Neutro Abs: 2200 cells/uL (ref 1500–7800)
Neutrophils Relative %: 38.6 %
Platelets: 150 10*3/uL (ref 140–400)
RBC: 5.11 10*6/uL (ref 4.20–5.80)
RDW: 13 % (ref 11.0–15.0)
Total Lymphocyte: 48.1 %
WBC: 5.7 10*3/uL (ref 3.8–10.8)

## 2022-04-02 LAB — COMPLETE METABOLIC PANEL WITH GFR
AG Ratio: 1.5 (calc) (ref 1.0–2.5)
ALT: 26 U/L (ref 9–46)
AST: 30 U/L (ref 10–35)
Albumin: 4.4 g/dL (ref 3.6–5.1)
Alkaline phosphatase (APISO): 85 U/L (ref 35–144)
BUN: 15 mg/dL (ref 7–25)
CO2: 24 mmol/L (ref 20–32)
Calcium: 9.4 mg/dL (ref 8.6–10.3)
Chloride: 104 mmol/L (ref 98–110)
Creat: 1.17 mg/dL (ref 0.70–1.35)
Globulin: 3 g/dL (calc) (ref 1.9–3.7)
Glucose, Bld: 75 mg/dL (ref 65–99)
Potassium: 4.2 mmol/L (ref 3.5–5.3)
Sodium: 139 mmol/L (ref 135–146)
Total Bilirubin: 0.8 mg/dL (ref 0.2–1.2)
Total Protein: 7.4 g/dL (ref 6.1–8.1)
eGFR: 68 mL/min/{1.73_m2} (ref >=60)

## 2022-04-02 NOTE — Assessment & Plan Note (Signed)
-   The patient's GERD is stable on medication.  - Instructed the patient to avoid eating spicy and acidic foods, as well as foods high in fat. - Instructed the patient to avoid eating large meals or meals 2-3 hours prior to sleeping. 

## 2022-04-02 NOTE — Assessment & Plan Note (Signed)

## 2022-04-02 NOTE — Assessment & Plan Note (Signed)
Patient denies any chest pain complaining of weakness and walking ?

## 2022-04-02 NOTE — Assessment & Plan Note (Signed)
Take Claritin 10 mg p.o. daily 

## 2022-04-02 NOTE — Assessment & Plan Note (Signed)

## 2022-04-02 NOTE — Addendum Note (Signed)
Addended by: Alois Cliche on: 04/02/2022 02:16 PM ? ? Modules accepted: Orders ? ?

## 2022-04-02 NOTE — Progress Notes (Signed)
? ?Established Patient Office Visit ? ?Subjective:  ?Patient ID: Thomas Hood, male    DOB: 1956/08/16  Age: 66 y.o. MRN: [id number] ? ?CC:  ?Chief Complaint  ?Patient presents with  ? Dizziness  ?  Patient states that he has a constant feeling of a bad hangover (whooziness)that has been going on over a year   ? ? ?Dizziness ?Associated symptoms include weakness. Pertinent negatives include no numbness.  ?Extremity Weakness  ?This is a new problem. The current episode started more than 1 year ago. The problem occurs daily. Pertinent negatives include no numbness.  ? ?Thomas Hood presents for dissiness and weak,  ? ?Past Medical History:  ?Diagnosis Date  ? Arthritis   ? Atherosclerosis   ? CAD (coronary artery disease)   ? Dizziness   ? HTN (hypertension)   ? Mitral valve prolapse   ? ? ?Past Surgical History:  ?Procedure Laterality Date  ? CHOLESTEATOMA EXCISION Right   ? COLONOSCOPY    ? 3-4 yrs. ago  Hx polyps  ? COLONOSCOPY WITH PROPOFOL N/A 05/01/2018  ? Procedure: COLONOSCOPY WITH PROPOFOL;  Surgeon: Virgel Manifold, MD;  Location: [facility];  Service: Endoscopy;  Laterality: N/A;  ? CORONARY ARTERY BYPASS GRAFT    ? ESOPHAGOGASTRODUODENOSCOPY (EGD) WITH PROPOFOL N/A 05/01/2018  ? Procedure: ESOPHAGOGASTRODUODENOSCOPY (EGD) WITH PROPOFOL;  Surgeon: Virgel Manifold, MD;  Location: [facility];  Service: Endoscopy;  Laterality: N/A;  ? TYMPANOPLASTY W/ MASTOIDECTOMY  08/23/2015  ? ? ?Family History  ?Problem Relation Age of Onset  ? Cancer Mother   ?     colon  ? Cancer Sister   ?     lung cancer-hx smoking  ? Heart failure Sister   ? ? ?Social History  ? ?Socioeconomic History  ? Marital status: Single  ?  Spouse name: Not on file  ? Number of children: Not on file  ? Years of education: Not on file  ? Highest education level: Not on file  ?Occupational History  ? Not on file  ?Tobacco Use  ? Smoking status: Former  ? Smokeless tobacco: Never  ?Vaping Use  ? Vaping Use: Never used   ?Substance and Sexual Activity  ? Alcohol use: Yes  ?  Comment: occ.  ? Drug use: Never  ? Sexual activity: Not on file  ?Other Topics Concern  ? Not on file  ?Social History Narrative  ? Not on file  ? ?Social Determinants of Health  ? ?Financial Resource Strain: Low Risk   ? Difficulty of Paying Living Expenses: Not hard at all  ?Food Insecurity: No Food Insecurity  ? Worried About Charity fundraiser in the Last Year: Never true  ? Ran Out of Food in the Last Year: Never true  ?Transportation Needs: No Transportation Needs  ? Lack of Transportation (Medical): No  ? Lack of Transportation (Non-Medical): No  ?Physical Activity: Sufficiently Active  ? Days of Exercise per Week: 4 days  ? Minutes of Exercise per Session: 40 min  ?Stress: No Stress Concern Present  ? Feeling of Stress : Not at all  ?Social Connections: Moderately Integrated  ? Frequency of Communication with Friends and Family: More than three times a week  ? Frequency of Social Gatherings with Friends and Family: More than three times a week  ? Attends Religious Services: Never  ? Active Member of Clubs or Organizations: No  ? Attends Archivist Meetings: More than 4 times per year  ? Marital  Status: Married  ?Intimate Partner Violence: Not At Risk  ? Fear of Current or Ex-Partner: No  ? Emotionally Abused: No  ? Physically Abused: No  ? Sexually Abused: No  ? ? ? ?Current Outpatient Medications:  ?  acetaminophen (TYLENOL) 500 MG tablet, Take 500 mg by mouth every 6 (six) hours as needed for mild pain or moderate pain., Disp: , Rfl:  ?  aspirin EC 81 MG tablet, Take by mouth., Disp: , Rfl:  ?  atorvastatin (LIPITOR) 40 MG tablet, TAKE 1 TABLET BY MOUTH ONCE DAILY FOR CHOLESTEROL, Disp: 100 tablet, Rfl: 0 ?  levothyroxine (SYNTHROID) 50 MCG tablet, TAKE 1 TABLET BY MOUTH ONCE DAILY., Disp: 90 tablet, Rfl: 0 ?  loratadine (CLARITIN) 10 MG tablet, Take 10 mg by mouth every evening. , Disp: , Rfl: 6 ?  metoprolol tartrate (LOPRESSOR) 25 MG  tablet, TAKE (1) TABLET BY MOUTH EVERY TWELVE HOURS., Disp: 180 tablet, Rfl: 0 ?  sildenafil (VIAGRA) 100 MG tablet, Take 0.5-1 tablets (50-100 mg total) by mouth daily as needed for erectile dysfunction., Disp: 5 tablet, Rfl: 11  ? ?No Known Allergies ? ?ROS ?Review of Systems  ?Constitutional: Negative.   ?HENT: Negative.    ?Eyes: Negative.   ?Respiratory: Negative.    ?Cardiovascular: Negative.   ?Gastrointestinal: Negative.   ?Endocrine: Negative.   ?Genitourinary: Negative.   ?Musculoskeletal:  Positive for extremity weakness.  ?Skin: Negative.   ?Allergic/Immunologic: Negative.   ?Neurological:  Positive for dizziness, tremors and weakness. Negative for syncope, light-headedness and numbness.  ?Hematological: Negative.   ?Psychiatric/Behavioral: Negative.  Negative for behavioral problems.   ?All other systems reviewed and are negative. ? ?  ?Objective:  ?  ?Physical Exam ?Constitutional:   ?   Appearance: Normal appearance. He is obese.  ?HENT:  ?   Head: Atraumatic.  ?   Nose: Nose normal.  ?Cardiovascular:  ?   Rate and Rhythm: Normal rate and regular rhythm.  ?Pulmonary:  ?   Effort: Pulmonary effort is normal.  ?Neurological:  ?   General: No focal deficit present.  ?Psychiatric:     ?   Behavior: Behavior normal.  ? ? ?BP 140/87   Pulse (!) 57   Ht '5\' 5"'$  (1.651 m)   Wt 247 lb 1.6 oz (112.1 kg)   BMI 41.12 kg/m?  ?Wt Readings from Last 3 Encounters:  ?04/02/22 247 lb 1.6 oz (112.1 kg)  ?11/21/20 244 lb 12.8 oz (111 kg)  ?11/10/20 244 lb 12.8 oz (111 kg)  ? ? ? ?Health Maintenance Due  ?Topic Date Due  ? Hepatitis C Screening  Never done  ? Zoster Vaccines- Shingrix (1 of 2) Never done  ? COVID-19 Vaccine (4 - Booster for Moderna series) 02/03/2021  ? ? ?There are no preventive care reminders to display for this patient. ? ?Lab Results  ?Component Value Date  ? TSH 4.68 (H) 11/14/2020  ? ?Lab Results  ?Component Value Date  ? WBC 4.9 11/14/2020  ? HGB 15.4 11/14/2020  ? HCT 45.4 11/14/2020  ? MCV 88.3  11/14/2020  ? PLT 153 11/14/2020  ? ?Lab Results  ?Component Value Date  ? NA 145 11/14/2020  ? K 4.6 11/14/2020  ? CO2 22 11/14/2020  ? GLUCOSE 95 11/14/2020  ? BUN 15 11/14/2020  ? CREATININE 1.21 11/14/2020  ? BILITOT 0.4 11/14/2020  ? ALKPHOS 64 11/24/2019  ? AST 29 11/14/2020  ? ALT 27 11/14/2020  ? PROT 7.1 11/14/2020  ? ALBUMIN 2.9 (L) 11/24/2019  ?  CALCIUM 9.6 11/14/2020  ? ANIONGAP 11 11/24/2019  ? ?Lab Results  ?Component Value Date  ? CHOL 164 11/14/2020  ? ?Lab Results  ?Component Value Date  ? HDL 61 11/14/2020  ? ?Lab Results  ?Component Value Date  ? [place] 89 11/14/2020  ? ?Lab Results  ?Component Value Date  ? TRIG 54 11/14/2020  ? ?Lab Results  ?Component Value Date  ? CHOLHDL 2.7 11/14/2020  ? ?Lab Results  ?Component Value Date  ? HGBA1C 6.6 (H) 11/22/2019  ? ? ?  ?Assessment & Plan:  ? ?Problem List Items Addressed This Visit   ? ?  ? Cardiovascular and Mediastinum  ? Essential hypertension - Primary  ?   Patient denies any chest pain or shortness of breath there is no history of palpitation or paroxysmal nocturnal dyspnea ?  patient was advised to follow low-salt low-cholesterol diet ? ?  ideally I want to keep systolic blood pressure below 130 mmHg, patient was asked to check blood pressure one times a week and give me a report on that.  Patient will be follow-up in 3 months  or earlier as needed, patient will call me back for any change in the cardiovascular symptoms ?Patient was advised to buy a book from local bookstore concerning blood pressure and read several chapters  every day.  This will be supplemented by some of the material we will give him from the office.  Patient should also utilize other resources like YouTube and Internet to learn more about the blood pressure and the diet. ? ?  ?  ? Coronary artery disease, non-occlusive  ?  Patient denies any chest pain complaining of weakness and walking ? ?  ?  ?  ? Respiratory  ? Seasonal allergic rhinitis due to pollen  ?  Take Claritin  10 mg p.o. daily ? ?  ?  ?  ? Digestive  ? Reflux esophagitis  ?  - The patient's GERD is stable on medication.  ?- Instructed the patient to avoid eating spicy and acidic foods, as well as foods high in fat. ?- In

## 2022-04-09 ENCOUNTER — Ambulatory Visit (INDEPENDENT_AMBULATORY_CARE_PROVIDER_SITE_OTHER): Payer: Medicare HMO | Admitting: Internal Medicine

## 2022-04-09 ENCOUNTER — Encounter: Payer: Self-pay | Admitting: Internal Medicine

## 2022-04-09 VITALS — BP 140/90 | HR 63 | Ht 65.0 in | Wt 247.3 lb

## 2022-04-09 DIAGNOSIS — J301 Allergic rhinitis due to pollen: Secondary | ICD-10-CM | POA: Diagnosis not present

## 2022-04-09 DIAGNOSIS — I2510 Atherosclerotic heart disease of native coronary artery without angina pectoris: Secondary | ICD-10-CM | POA: Diagnosis not present

## 2022-04-09 DIAGNOSIS — K2100 Gastro-esophageal reflux disease with esophagitis, without bleeding: Secondary | ICD-10-CM | POA: Diagnosis not present

## 2022-04-09 DIAGNOSIS — E038 Other specified hypothyroidism: Secondary | ICD-10-CM

## 2022-04-09 DIAGNOSIS — N529 Male erectile dysfunction, unspecified: Secondary | ICD-10-CM

## 2022-04-09 DIAGNOSIS — E8881 Metabolic syndrome: Secondary | ICD-10-CM

## 2022-04-09 DIAGNOSIS — I10 Essential (primary) hypertension: Secondary | ICD-10-CM

## 2022-04-09 DIAGNOSIS — E063 Autoimmune thyroiditis: Secondary | ICD-10-CM

## 2022-04-09 NOTE — Assessment & Plan Note (Signed)
Patient was advised to take levothyroxine empty stomach in the morning ?

## 2022-04-09 NOTE — Assessment & Plan Note (Signed)
-   The patient's GERD is stable on medication.  - Instructed the patient to avoid eating spicy and acidic foods, as well as foods high in fat. - Instructed the patient to avoid eating large meals or meals 2-3 hours prior to sleeping. 

## 2022-04-09 NOTE — Assessment & Plan Note (Signed)
Patient denies chest pain or shortness of breath.  He was advised to lose weight. ?

## 2022-04-09 NOTE — Assessment & Plan Note (Signed)
Patient was advised to take Claritin 5 mg p.o. daily for allergy ?

## 2022-04-09 NOTE — Progress Notes (Signed)
? ?Established Patient Office Visit ? ?Subjective:  ?Patient ID: Thomas Hood, male    DOB: 08-Jun-1956  Age: 66 y.o. MRN: [id number] ? ?CC:  ?Chief Complaint  ?Patient presents with  ? lab results  ? ? ?HPI ? ?Thomas Hood presents for general check up ? ?Past Medical History:  ?Diagnosis Date  ? Arthritis   ? Atherosclerosis   ? CAD (coronary artery disease)   ? Dizziness   ? HTN (hypertension)   ? Mitral valve prolapse   ? ? ?Past Surgical History:  ?Procedure Laterality Date  ? CHOLESTEATOMA EXCISION Right   ? COLONOSCOPY    ? 3-4 yrs. ago  Hx polyps  ? COLONOSCOPY WITH PROPOFOL N/A 05/01/2018  ? Procedure: COLONOSCOPY WITH PROPOFOL;  Surgeon: Virgel Manifold, MD;  Location: [facility];  Service: Endoscopy;  Laterality: N/A;  ? CORONARY ARTERY BYPASS GRAFT    ? ESOPHAGOGASTRODUODENOSCOPY (EGD) WITH PROPOFOL N/A 05/01/2018  ? Procedure: ESOPHAGOGASTRODUODENOSCOPY (EGD) WITH PROPOFOL;  Surgeon: Virgel Manifold, MD;  Location: [facility];  Service: Endoscopy;  Laterality: N/A;  ? TYMPANOPLASTY W/ MASTOIDECTOMY  08/23/2015  ? ? ?Family History  ?Problem Relation Age of Onset  ? Cancer Mother   ?     colon  ? Cancer Sister   ?     lung cancer-hx smoking  ? Heart failure Sister   ? ? ?Social History  ? ?Socioeconomic History  ? Marital status: Single  ?  Spouse name: Not on file  ? Number of children: Not on file  ? Years of education: Not on file  ? Highest education level: Not on file  ?Occupational History  ? Not on file  ?Tobacco Use  ? Smoking status: Former  ? Smokeless tobacco: Never  ?Vaping Use  ? Vaping Use: Never used  ?Substance and Sexual Activity  ? Alcohol use: Yes  ?  Comment: occ.  ? Drug use: Never  ? Sexual activity: Not on file  ?Other Topics Concern  ? Not on file  ?Social History Narrative  ? Not on file  ? ?Social Determinants of Health  ? ?Financial Resource Strain: Low Risk   ? Difficulty of Paying Living Expenses: Not hard at all  ?Food Insecurity: No Food Insecurity  ?  Worried About Charity fundraiser in the Last Year: Never true  ? Ran Out of Food in the Last Year: Never true  ?Transportation Needs: No Transportation Needs  ? Lack of Transportation (Medical): No  ? Lack of Transportation (Non-Medical): No  ?Physical Activity: Sufficiently Active  ? Days of Exercise per Week: 4 days  ? Minutes of Exercise per Session: 40 min  ?Stress: No Stress Concern Present  ? Feeling of Stress : Not at all  ?Social Connections: Moderately Integrated  ? Frequency of Communication with Friends and Family: More than three times a week  ? Frequency of Social Gatherings with Friends and Family: More than three times a week  ? Attends Religious Services: Never  ? Active Member of Clubs or Organizations: No  ? Attends Archivist Meetings: More than 4 times per year  ? Marital Status: Married  ?Intimate Partner Violence: Not At Risk  ? Fear of Current or Ex-Partner: No  ? Emotionally Abused: No  ? Physically Abused: No  ? Sexually Abused: No  ? ? ? ?Current Outpatient Medications:  ?  acetaminophen (TYLENOL) 500 MG tablet, Take 500 mg by mouth every 6 (six) hours as needed for mild pain  or moderate pain., Disp: , Rfl:  ?  aspirin EC 81 MG tablet, Take by mouth., Disp: , Rfl:  ?  atorvastatin (LIPITOR) 40 MG tablet, TAKE 1 TABLET BY MOUTH ONCE DAILY FOR CHOLESTEROL, Disp: 100 tablet, Rfl: 0 ?  levothyroxine (SYNTHROID) 50 MCG tablet, TAKE 1 TABLET BY MOUTH ONCE DAILY., Disp: 90 tablet, Rfl: 0 ?  loratadine (CLARITIN) 10 MG tablet, Take 10 mg by mouth every evening. , Disp: , Rfl: 6 ?  metoprolol tartrate (LOPRESSOR) 25 MG tablet, TAKE (1) TABLET BY MOUTH EVERY TWELVE HOURS., Disp: 180 tablet, Rfl: 0 ?  sildenafil (VIAGRA) 100 MG tablet, Take 0.5-1 tablets (50-100 mg total) by mouth daily as needed for erectile dysfunction., Disp: 5 tablet, Rfl: 11  ? ?No Known Allergies ? ?ROS ?Review of Systems  ?Constitutional: Negative.   ?HENT: Negative.    ?Eyes: Negative.   ?Respiratory: Negative.     ?Cardiovascular: Negative.   ?Gastrointestinal: Negative.   ?Endocrine: Negative.   ?Genitourinary: Negative.   ?Musculoskeletal: Negative.   ?Skin: Negative.   ?Allergic/Immunologic: Negative.   ?Neurological: Negative.   ?Hematological: Negative.   ?Psychiatric/Behavioral: Negative.    ?All other systems reviewed and are negative. ? ?  ?Objective:  ?  ?Physical Exam ?Vitals reviewed.  ?Constitutional:   ?   Appearance: Normal appearance.  ?HENT:  ?   Mouth/Throat:  ?   Mouth: Mucous membranes are moist.  ?Eyes:  ?   Pupils: Pupils are equal, round, and reactive to light.  ?Neck:  ?   Vascular: No carotid bruit.  ?Cardiovascular:  ?   Rate and Rhythm: Normal rate and regular rhythm.  ?   Pulses: Normal pulses.  ?   Heart sounds: Normal heart sounds.  ?Pulmonary:  ?   Effort: Pulmonary effort is normal.  ?   Breath sounds: Normal breath sounds.  ?Abdominal:  ?   General: Bowel sounds are normal.  ?   Palpations: Abdomen is soft. There is no hepatomegaly, splenomegaly or mass.  ?   Tenderness: There is no abdominal tenderness.  ?   Hernia: No hernia is present.  ?Musculoskeletal:  ?   Cervical back: Neck supple.  ?   Right lower leg: No edema.  ?   Left lower leg: No edema.  ?Skin: ?   Findings: No rash.  ?Neurological:  ?   Mental Status: He is alert and oriented to person, place, and time.  ?   Motor: No weakness.  ?Psychiatric:     ?   Mood and Affect: Mood normal.     ?   Behavior: Behavior normal.  ? ? ?BP 140/90   Pulse 63   Ht 5' 5"  (1.651 m)   Wt 247 lb 4.8 oz (112.2 kg)   BMI 41.15 kg/m?  ?Wt Readings from Last 3 Encounters:  ?04/09/22 247 lb 4.8 oz (112.2 kg)  ?04/02/22 247 lb 1.6 oz (112.1 kg)  ?11/21/20 244 lb 12.8 oz (111 kg)  ? ? ? ?Health Maintenance Due  ?Topic Date Due  ? Hepatitis C Screening  Never done  ? Zoster Vaccines- Shingrix (1 of 2) Never done  ? COVID-19 Vaccine (4 - Booster for Moderna series) 02/03/2021  ? ? ?There are no preventive care reminders to display for this patient. ? ?Lab  Results  ?Component Value Date  ? TSH 4.68 (H) 11/14/2020  ? ?Lab Results  ?Component Value Date  ? WBC 5.7 04/02/2022  ? HGB 15.7 04/02/2022  ? HCT 46.5 04/02/2022  ?  MCV 91.0 04/02/2022  ? PLT 150 04/02/2022  ? ?Lab Results  ?Component Value Date  ? NA 139 04/02/2022  ? K 4.2 04/02/2022  ? CO2 24 04/02/2022  ? GLUCOSE 75 04/02/2022  ? BUN 15 04/02/2022  ? CREATININE 1.17 04/02/2022  ? BILITOT 0.8 04/02/2022  ? ALKPHOS 64 11/24/2019  ? AST 30 04/02/2022  ? ALT 26 04/02/2022  ? PROT 7.4 04/02/2022  ? ALBUMIN 2.9 (L) 11/24/2019  ? CALCIUM 9.4 04/02/2022  ? ANIONGAP 11 11/24/2019  ? EGFR 68 04/02/2022  ? ?Lab Results  ?Component Value Date  ? CHOL 164 11/14/2020  ? ?Lab Results  ?Component Value Date  ? HDL 61 11/14/2020  ? ?Lab Results  ?Component Value Date  ? [place] 89 11/14/2020  ? ?Lab Results  ?Component Value Date  ? TRIG 54 11/14/2020  ? ?Lab Results  ?Component Value Date  ? CHOLHDL 2.7 11/14/2020  ? ?Lab Results  ?Component Value Date  ? HGBA1C 6.6 (H) 11/22/2019  ? ? ?  ?Assessment & Plan:  ? ?Problem List Items Addressed This Visit   ? ?  ? Cardiovascular and Mediastinum  ? Essential hypertension  ?   Patient denies any chest pain or shortness of breath there is no history of palpitation or paroxysmal nocturnal dyspnea ?  patient was advised to follow low-salt low-cholesterol diet ? ?  ideally I want to keep systolic blood pressure below 130 mmHg, patient was asked to check blood pressure one times a week and give me a report on that.  Patient will be follow-up in 3 months  or earlier as needed, patient will call me back for any change in the cardiovascular symptoms ?Patient was advised to buy a book from local bookstore concerning blood pressure and read several chapters  every day.  This will be supplemented by some of the material we will give him from the office.  Patient should also utilize other resources like YouTube and Internet to learn more about the blood pressure and the diet. ? ?  ?  ?  Coronary artery disease, non-occlusive - Primary  ?  Patient denies chest pain or shortness of breath.  He was advised to lose weight. ? ?  ?  ?  ? Respiratory  ? Seasonal allergic rhinitis due to pollen  ?  Patient

## 2022-04-09 NOTE — Addendum Note (Signed)
Addended by: Alois Cliche on: 04/09/2022 10:56 AM ? ? Modules accepted: Orders ? ?

## 2022-04-09 NOTE — Assessment & Plan Note (Signed)

## 2022-04-09 NOTE — Assessment & Plan Note (Signed)

## 2022-04-23 ENCOUNTER — Encounter: Payer: Self-pay | Admitting: Urology

## 2022-04-23 ENCOUNTER — Ambulatory Visit: Payer: Medicare HMO | Admitting: Urology

## 2022-04-23 VITALS — BP 136/80 | HR 70 | Ht 67.0 in | Wt 240.0 lb

## 2022-04-23 DIAGNOSIS — N5201 Erectile dysfunction due to arterial insufficiency: Secondary | ICD-10-CM | POA: Diagnosis not present

## 2022-04-23 NOTE — Progress Notes (Signed)
? ?04/23/2022 ?10:44 AM  ? ?Thomas Hood ?30-Mar-1956 ?[id number] ? ?Referring provider: Cletis Athens, MD ?[address] ?[place],  [place] [address] ? ?Chief Complaint  ?Patient presents with  ? Erectile Dysfunction  ? ? ?HPI: ?Thomas Hood is a 66 y.o. male referred for evaluation of erectile dysfunction. ? ?3-year history of ED ?Partial erections which are not firm enough for penetration ?Has tried tadalafil and sildenafil which increased tumescence but not firm enough for penetration ?No pain or curvature with erections ?Organic risk factors include coronary artery disease, hypertension, antihypertensive medication.  Smoked 3 packs/day x 15 years, quit 40 years ago ? ? ?PMH: ?Past Medical History:  ?Diagnosis Date  ? Arthritis   ? Atherosclerosis   ? CAD (coronary artery disease)   ? Dizziness   ? HTN (hypertension)   ? Mitral valve prolapse   ? ? ?Surgical History: ?Past Surgical History:  ?Procedure Laterality Date  ? CHOLESTEATOMA EXCISION Right   ? COLONOSCOPY    ? 3-4 yrs. ago  Hx polyps  ? COLONOSCOPY WITH PROPOFOL N/A 05/01/2018  ? Procedure: COLONOSCOPY WITH PROPOFOL;  Surgeon: Virgel Manifold, MD;  Location: [facility];  Service: Endoscopy;  Laterality: N/A;  ? CORONARY ARTERY BYPASS GRAFT    ? ESOPHAGOGASTRODUODENOSCOPY (EGD) WITH PROPOFOL N/A 05/01/2018  ? Procedure: ESOPHAGOGASTRODUODENOSCOPY (EGD) WITH PROPOFOL;  Surgeon: Virgel Manifold, MD;  Location: [facility];  Service: Endoscopy;  Laterality: N/A;  ? TYMPANOPLASTY W/ MASTOIDECTOMY  08/23/2015  ? ? ?Home Medications:  ?Allergies as of 04/23/2022   ?No Known Allergies ?  ? ?  ?Medication List  ?  ? ?  ? Accurate as of Apr 23, 2022 10:44 AM. If you have any questions, ask your nurse or doctor.  ?  ?  ? ?  ? ?acetaminophen 500 MG tablet ?Commonly known as: TYLENOL ?Take 500 mg by mouth every 6 (six) hours as needed for mild pain or moderate pain. ?  ?aspirin EC 81 MG tablet ?Take by mouth. ?  ?atorvastatin 40 MG tablet ?Commonly known  as: LIPITOR ?TAKE 1 TABLET BY MOUTH ONCE DAILY FOR CHOLESTEROL ?  ?levothyroxine 50 MCG tablet ?Commonly known as: SYNTHROID ?TAKE 1 TABLET BY MOUTH ONCE DAILY. ?  ?loratadine 10 MG tablet ?Commonly known as: CLARITIN ?Take 10 mg by mouth every evening. ?  ?metoprolol tartrate 25 MG tablet ?Commonly known as: LOPRESSOR ?TAKE (1) TABLET BY MOUTH EVERY TWELVE HOURS. ?  ?sildenafil 100 MG tablet ?Commonly known as: Viagra ?Take 0.5-1 tablets (50-100 mg total) by mouth daily as needed for erectile dysfunction. ?  ? ?  ? ? ?Allergies: No Known Allergies ? ?Family History: ?Family History  ?Problem Relation Age of Onset  ? Cancer Mother   ?     colon  ? Cancer Sister   ?     lung cancer-hx smoking  ? Heart failure Sister   ? ? ?Social History:  reports that he has quit smoking. He has never used smokeless tobacco. He reports current alcohol use. He reports that he does not use drugs. ? ? ?Physical Exam: ?BP 136/80   Pulse 70   Ht '5\' 7"'$  (1.702 m)   Wt 240 lb (108.9 kg)   BMI 37.59 kg/m?   ?Constitutional:  Alert and oriented, No acute distress. ?HEENT: [place] AT, moist mucus membranes.  Trachea midline, no masses. ?Cardiovascular: No clubbing, cyanosis, or edema. ?Respiratory: Normal respiratory effort, no increased work of breathing. ?GU: Phallus without lesions.  Testes descended bilaterally without  masses or tenderness. ?Skin: No rashes, bruises or suspicious lesions. ?Psychiatric: Normal mood and affect. ? ? ?Assessment & Plan:   ? ?1.  Erectile dysfunction ?Significant organic risk factors ?PDE 5 inhibitor refractory ?Second line options were discussed including vacuum erection devices and intracavernosal injections ?Penile implant surgery was also discussed ?He was provided literature and if interested in pursuing any of these options will call back ? ? ?Abbie Sons, MD ? ?[place] ?[address], [address] ?[place], [place] [address] ?[phone] ? ?

## 2022-05-19 ENCOUNTER — Other Ambulatory Visit: Payer: Self-pay | Admitting: Internal Medicine

## 2022-05-19 DIAGNOSIS — E038 Other specified hypothyroidism: Secondary | ICD-10-CM

## 2022-07-10 ENCOUNTER — Ambulatory Visit: Payer: Medicare HMO | Admitting: Internal Medicine

## 2022-08-01 ENCOUNTER — Ambulatory Visit (INDEPENDENT_AMBULATORY_CARE_PROVIDER_SITE_OTHER): Payer: Medicare HMO | Admitting: Internal Medicine

## 2022-08-01 ENCOUNTER — Encounter: Payer: Self-pay | Admitting: Internal Medicine

## 2022-08-01 VITALS — BP 117/73 | HR 59 | Ht 67.0 in | Wt 242.7 lb

## 2022-08-01 DIAGNOSIS — I2510 Atherosclerotic heart disease of native coronary artery without angina pectoris: Secondary | ICD-10-CM

## 2022-08-01 DIAGNOSIS — I10 Essential (primary) hypertension: Secondary | ICD-10-CM

## 2022-08-01 DIAGNOSIS — E038 Other specified hypothyroidism: Secondary | ICD-10-CM

## 2022-08-01 DIAGNOSIS — R12 Heartburn: Secondary | ICD-10-CM

## 2022-08-01 DIAGNOSIS — E063 Autoimmune thyroiditis: Secondary | ICD-10-CM

## 2022-08-01 DIAGNOSIS — E8881 Metabolic syndrome: Secondary | ICD-10-CM

## 2022-08-01 DIAGNOSIS — N5201 Erectile dysfunction due to arterial insufficiency: Secondary | ICD-10-CM

## 2022-08-01 MED ORDER — LEVOTHYROXINE SODIUM 50 MCG PO TABS: 50.0000 ug | ORAL_TABLET | Freq: Every day | ORAL | 6 refills | Status: DC

## 2022-08-01 MED ORDER — METOPROLOL TARTRATE 25 MG PO TABS: ORAL_TABLET | ORAL | 6 refills | Status: DC

## 2022-08-01 MED ORDER — SILDENAFIL CITRATE 100 MG PO TABS: 50.0000 mg | ORAL_TABLET | Freq: Every day | ORAL | 11 refills | Status: DC | PRN

## 2022-08-01 MED ORDER — ATORVASTATIN CALCIUM 40 MG PO TABS: 40.0000 mg | ORAL_TABLET | Freq: Every day | ORAL | 6 refills | Status: DC

## 2022-08-01 NOTE — Assessment & Plan Note (Signed)

## 2022-08-01 NOTE — Assessment & Plan Note (Signed)
Patient does not have any anginal heart is regular chest no rhonchi.  We will check a lipid level.

## 2022-08-01 NOTE — Assessment & Plan Note (Signed)

## 2022-08-01 NOTE — Assessment & Plan Note (Signed)
Reflux precautions discussed with the patient

## 2022-08-01 NOTE — Progress Notes (Signed)
Established Patient Office Visit  Subjective:  Patient ID: Thomas Hood, male    DOB: Jun 05, 1956  Age: 66 y.o. MRN: [id number]  CC:  Chief Complaint  Patient presents with   Hypertension    Hypertension  , patient came for general checkup.  #2 coronary artery disease has a history of bypass surgery in [place].  We will check his blood test also checking for hepatitis C advised him to take shingles vaccine and pneumonia vaccine.  He says he does not smoke does not drink.  He takes his cholesterol medicine regularly.  He is not having any angina or shortness of breath or swelling of the legs or any arrhythmia at the present time.  Thomas Hood presents for check up  Past Medical History:  Diagnosis Date   Arthritis    Atherosclerosis    CAD (coronary artery disease)    Dizziness    HTN (hypertension)    Mitral valve prolapse     Past Surgical History:  Procedure Laterality Date   CHOLESTEATOMA EXCISION Right    COLONOSCOPY     3-4 yrs. ago  Hx polyps   COLONOSCOPY WITH PROPOFOL N/A 05/01/2018   Procedure: COLONOSCOPY WITH PROPOFOL;  Surgeon: Virgel Manifold, MD;  Location: [facility] ENDOSCOPY;  Service: Endoscopy;  Laterality: N/A;   CORONARY ARTERY BYPASS GRAFT     ESOPHAGOGASTRODUODENOSCOPY (EGD) WITH PROPOFOL N/A 05/01/2018   Procedure: ESOPHAGOGASTRODUODENOSCOPY (EGD) WITH PROPOFOL;  Surgeon: Virgel Manifold, MD;  Location: [facility] ENDOSCOPY;  Service: Endoscopy;  Laterality: N/A;   TYMPANOPLASTY W/ MASTOIDECTOMY  08/23/2015    Family History  Problem Relation Age of Onset   Cancer Mother        colon   Cancer Sister        lung cancer-hx smoking   Heart failure Sister     Social History   Socioeconomic History   Marital status: Single    Spouse name: Not on file   Number of children: Not on file   Years of education: Not on file   Highest education level: Not on file  Occupational History   Not on file  Tobacco Use   Smoking status: Former    Smokeless tobacco: Never  Vaping Use   Vaping Use: Never used  Substance and Sexual Activity   Alcohol use: Yes    Comment: occ.   Drug use: Never   Sexual activity: Not on file  Other Topics Concern   Not on file  Social History Narrative   Not on file   Social Determinants of Health   Financial Resource Strain: Low Risk  (11/10/2021)   Overall Financial Resource Strain (CARDIA)    Difficulty of Paying Living Expenses: Not hard at all  Food Insecurity: No Food Insecurity (11/10/2021)   Hunger Vital Sign    Worried About Running Out of Food in the Last Year: Never true    Ran Out of Food in the Last Year: Never true  Transportation Needs: No Transportation Needs (11/10/2021)   PRAPARE - Hydrologist (Medical): No    Lack of Transportation (Non-Medical): No  Physical Activity: Sufficiently Active (11/10/2021)   Exercise Vital Sign    Days of Exercise per Week: 4 days    Minutes of Exercise per Session: 40 min  Stress: No Stress Concern Present (11/10/2021)   [place]    Feeling of Stress : Not at all  Social Connections:  Moderately Integrated (11/10/2021)   Social Connection and Isolation Panel [NHANES]    Frequency of Communication with Friends and Family: More than three times a week    Frequency of Social Gatherings with Friends and Family: More than three times a week    Attends Religious Services: Never    Marine scientist or Organizations: No    Attends Music therapist: More than 4 times per year    Marital Status: Married  Human resources officer Violence: Not At Risk (11/10/2021)   Humiliation, Afraid, Rape, and Kick questionnaire    Fear of Current or Ex-Partner: No    Emotionally Abused: No    Physically Abused: No    Sexually Abused: No     Current Outpatient Medications:    acetaminophen (TYLENOL) 500 MG tablet, Take 500 mg by mouth every 6 (six) hours as  needed for mild pain or moderate pain., Disp: , Rfl:    aspirin EC 81 MG tablet, Take by mouth., Disp: , Rfl:    atorvastatin (LIPITOR) 40 MG tablet, Take 1 tablet (40 mg total) by mouth daily. for cholesterol., Disp: 30 tablet, Rfl: 6   levothyroxine (SYNTHROID) 50 MCG tablet, Take 1 tablet (50 mcg total) by mouth daily., Disp: 30 tablet, Rfl: 6   loratadine (CLARITIN) 10 MG tablet, Take 10 mg by mouth every evening. , Disp: , Rfl: 6   metoprolol tartrate (LOPRESSOR) 25 MG tablet, TAKE (1) TABLET BY MOUTH EVERY TWELVE HOURS., Disp: 60 tablet, Rfl: 6   sildenafil (VIAGRA) 100 MG tablet, Take 0.5-1 tablets (50-100 mg total) by mouth daily as needed for erectile dysfunction., Disp: 10 tablet, Rfl: 11   No Known Allergies  ROS Review of Systems  Constitutional: Negative.   HENT: Negative.    Eyes: Negative.   Respiratory: Negative.    Cardiovascular: Negative.   Gastrointestinal: Negative.   Endocrine: Negative.   Genitourinary: Negative.   Musculoskeletal: Negative.   Skin: Negative.   Allergic/Immunologic: Negative.   Neurological: Negative.   Hematological: Negative.   Psychiatric/Behavioral: Negative.    All other systems reviewed and are negative.     Objective:    Physical Exam Vitals reviewed.  Constitutional:      Appearance: Normal appearance.  HENT:     Mouth/Throat:     Mouth: Mucous membranes are moist.  Eyes:     Pupils: Pupils are equal, round, and reactive to light.  Neck:     Vascular: No carotid bruit.  Cardiovascular:     Rate and Rhythm: Normal rate and regular rhythm.     Pulses: Normal pulses.     Heart sounds: Normal heart sounds.  Pulmonary:     Effort: Pulmonary effort is normal.     Breath sounds: Normal breath sounds.  Abdominal:     General: Bowel sounds are normal.     Palpations: Abdomen is soft. There is no hepatomegaly, splenomegaly or mass.     Tenderness: There is no abdominal tenderness.     Hernia: No hernia is present.   Musculoskeletal:     Cervical back: Neck supple.     Right lower leg: No edema.     Left lower leg: No edema.  Skin:    Findings: No rash.  Neurological:     Mental Status: He is alert and oriented to person, place, and time.     Motor: No weakness.  Psychiatric:        Mood and Affect: Mood normal.  Behavior: Behavior normal.     BP 117/73   Pulse (!) 59   Ht 5' 7"  (1.702 m)   Wt 242 lb 11.2 oz (110.1 kg)   BMI 38.01 kg/m  Wt Readings from Last 3 Encounters:  08/01/22 242 lb 11.2 oz (110.1 kg)  04/23/22 240 lb (108.9 kg)  04/09/22 247 lb 4.8 oz (112.2 kg)     Health Maintenance Due  Topic Date Due   Hepatitis C Screening  Never done   Zoster Vaccines- Shingrix (1 of 2) Never done   COVID-19 Vaccine (4 - Moderna risk series) 02/03/2021   INFLUENZA VACCINE  07/10/2022    There are no preventive care reminders to display for this patient.  Lab Results  Component Value Date   TSH 4.68 (H) 11/14/2020   Lab Results  Component Value Date   WBC 5.7 04/02/2022   HGB 15.7 04/02/2022   HCT 46.5 04/02/2022   MCV 91.0 04/02/2022   PLT 150 04/02/2022   Lab Results  Component Value Date   NA 139 04/02/2022   K 4.2 04/02/2022   CO2 24 04/02/2022   GLUCOSE 75 04/02/2022   BUN 15 04/02/2022   CREATININE 1.17 04/02/2022   BILITOT 0.8 04/02/2022   ALKPHOS 64 11/24/2019   AST 30 04/02/2022   ALT 26 04/02/2022   PROT 7.4 04/02/2022   ALBUMIN 2.9 (L) 11/24/2019   CALCIUM 9.4 04/02/2022   ANIONGAP 11 11/24/2019   EGFR 68 04/02/2022   Lab Results  Component Value Date   CHOL 164 11/14/2020   Lab Results  Component Value Date   HDL 61 11/14/2020   Lab Results  Component Value Date   LDLCALC 89 11/14/2020   Lab Results  Component Value Date   TRIG 54 11/14/2020   Lab Results  Component Value Date   CHOLHDL 2.7 11/14/2020   Lab Results  Component Value Date   HGBA1C 6.6 (H) 11/22/2019      Assessment & Plan:   Problem List Items Addressed  This Visit       Cardiovascular and Mediastinum   Essential hypertension     Patient denies any chest pain or shortness of breath there is no history of palpitation or paroxysmal nocturnal dyspnea   patient was advised to follow low-salt low-cholesterol diet    ideally I want to keep systolic blood pressure below 130 mmHg, patient was asked to check blood pressure one times a week and give me a report on that.  Patient will be follow-up in 3 months  or earlier as needed, patient will call me back for any change in the cardiovascular symptoms Patient was advised to buy a book from local bookstore concerning blood pressure and read several chapters  every day.  This will be supplemented by some of the material we will give him from the office.  Patient should also utilize other resources like YouTube and Internet to learn more about the blood pressure and the diet.      Relevant Medications   atorvastatin (LIPITOR) 40 MG tablet   metoprolol tartrate (LOPRESSOR) 25 MG tablet   sildenafil (VIAGRA) 100 MG tablet   Coronary artery disease, non-occlusive - Primary    Patient does not have any anginal heart is regular chest no rhonchi.  We will check a lipid level.      Relevant Medications   atorvastatin (LIPITOR) 40 MG tablet   metoprolol tartrate (LOPRESSOR) 25 MG tablet   sildenafil (VIAGRA) 100 MG tablet  Erectile dysfunction due to arterial insufficiency   Relevant Medications   atorvastatin (LIPITOR) 40 MG tablet   metoprolol tartrate (LOPRESSOR) 25 MG tablet   sildenafil (VIAGRA) 100 MG tablet     Endocrine   Hypothyroidism due to Hashimoto's thyroiditis   Relevant Medications   levothyroxine (SYNTHROID) 50 MCG tablet   metoprolol tartrate (LOPRESSOR) 25 MG tablet     Other   Heartburn    Reflux precautions discussed with the patient      Metabolic syndrome    - I encouraged the patient to lose weight.  - I educated them on making healthy dietary choices including eating  more fruits and vegetables and less fried foods. - I encouraged the patient to exercise more, and educated on the benefits of exercise including weight loss, diabetes prevention, and hypertension prevention.   Dietary counseling with a registered dietician  Referral to a weight management support group (e.g. Weight Watchers, Overeaters Anonymous)  If your BMI is greater than 29 or you have gained more than 15 pounds you should work on weight loss.  Attend a healthy cooking class        Meds ordered this encounter  Medications   atorvastatin (LIPITOR) 40 MG tablet    Sig: Take 1 tablet (40 mg total) by mouth daily. for cholesterol.    Dispense:  30 tablet    Refill:  6   levothyroxine (SYNTHROID) 50 MCG tablet    Sig: Take 1 tablet (50 mcg total) by mouth daily.    Dispense:  30 tablet    Refill:  6   metoprolol tartrate (LOPRESSOR) 25 MG tablet    Sig: TAKE (1) TABLET BY MOUTH EVERY TWELVE HOURS.    Dispense:  60 tablet    Refill:  6   sildenafil (VIAGRA) 100 MG tablet    Sig: Take 0.5-1 tablets (50-100 mg total) by mouth daily as needed for erectile dysfunction.    Dispense:  10 tablet    Refill:  11    Follow-up: No follow-ups on file.  Patient was advised to lose weight.  Walk on a daily basis.  Advised to receive pneumonia vaccine and shingles shot, his pharmacist can give that to him.  Cletis Athens, MD

## 2023-03-11 ENCOUNTER — Encounter: Payer: Self-pay | Admitting: Urology

## 2023-03-11 ENCOUNTER — Ambulatory Visit (INDEPENDENT_AMBULATORY_CARE_PROVIDER_SITE_OTHER): Payer: Medicare HMO | Admitting: Urology

## 2023-03-11 DIAGNOSIS — N5201 Erectile dysfunction due to arterial insufficiency: Secondary | ICD-10-CM

## 2023-03-11 MED ORDER — NONFORMULARY OR COMPOUNDED ITEM: 6 refills | Status: DC

## 2023-03-11 NOTE — Progress Notes (Signed)
The patient was not seen by me today.  He had previously been seen for ED which was PDE 5 inhibitor refractory.  Second line options had been discussed.  He was interested in pursuing intracavernosal injections at a PA appointment for training was made.  [facility]

## 2023-03-19 NOTE — Progress Notes (Signed)
   03/19/2023 8:22 AM  Thomas Hood [birth year] [id number]   Referring provider: Corky Downs, MD [address] [place],  [place] [address]  Urological history 1.  Erectile dysfunction -Contributing factors of age, CAD, HTN, history of smoking, BP meds, hypothyroidism, Parkinson disease, HLD and possible sleep apnea -Erection is not firm enough with PDE 5 inhibitors for penetration   No chief complaint on file.   HPI: Thomas Hood is a 67 y.o. male who presents today for ICI titration.  The procedure is discussed with patient.  He is allowed to ask questions.  Questions were answered to his satisfaction.  We were able to proceed to the titration.  Physical Exam:  There were no vitals taken for this visit.  Constitutional:  Well nourished. Alert and oriented, No acute distress. GU: No CVA tenderness.  No bladder fullness or masses.  Patient with circumcised/uncircumcised phallus. ***Foreskin easily retracted***  Urethral meatus is patent.  No penile discharge. No penile lesions or rashes. Scrotum without lesions, cysts, rashes and/or edema.   Psychiatric: Normal mood and affect.   Procedure *** Patient's left corpus cavernosum is identified.  An area near the base of the penis is cleansed with rubbing alcohol.  Careful to avoid the dorsal vein, *** mcg of Trimix (papaverine 30 mg, phentolamine 1 mg and prostaglandin E1 10 mcg, Lot # ***@*** exp # *** is injected at a 90 degree angle into the left *** corpus cavernosum near the base of the penis.  Patient experienced a very firm erection in 15 minutes.     Assessment & Plan:    1.  Erectile dysfunction Advised patient of the condition of priapism, painful erection lasting for more than four hours, and to contact the office or seek treatment in the ED immediately     No follow-ups on file.  Oris Drone [facility]  [facility] [address] [place], [place]  [address] [phone]

## 2023-03-20 ENCOUNTER — Ambulatory Visit: Payer: Medicare HMO | Admitting: Urology

## 2023-03-20 ENCOUNTER — Encounter: Payer: Self-pay | Admitting: Urology

## 2023-03-20 VITALS — BP 131/84 | HR 88 | Ht 65.0 in | Wt 240.0 lb

## 2023-03-20 DIAGNOSIS — N5201 Erectile dysfunction due to arterial insufficiency: Secondary | ICD-10-CM | POA: Diagnosis not present

## 2023-03-21 ENCOUNTER — Telehealth: Payer: Self-pay | Admitting: Urology

## 2023-03-21 LAB — TESTOSTERONE: Testosterone: 1147 ng/dL — ABNORMAL HIGH (ref 264–916)

## 2023-03-21 NOTE — Telephone Encounter (Signed)
Advised patient we will call him once Dr. Lonna Cobb  reviews results

## 2023-03-21 NOTE — Telephone Encounter (Signed)
Pt called asking about results from testosterone labs done yesterday.

## 2023-03-27 NOTE — Progress Notes (Unsigned)
   03/28/2023 8:56 AM  Thomas Hood 05/01/1956 [id number]   Referring provider: Corky Downs, MD [address] [place],  [place] [address]  Urological history 1.  Erectile dysfunction -Contributing factors of age, CAD, HTN, history of smoking, BP meds, hypothyroidism, Parkinson disease, HLD and possible sleep apnea -Erection is not firm enough with PDE 5 inhibitors for penetration   Chief Complaint  Patient presents with   Erectile Dysfunction    HPI: Thomas Hood is a 67 y.o. male who presents today for second ICI titration with his wife, [name].   At his visit on March 20, 2023, he only achieved penile fullness after injection of 0.4 of the Trimix.  He was also wondering about his testosterone level.  He states he suffers from constant fatigue and low energy.   Surprisingly, his testosterone returned at 1147.  He denied any exogenous testosterone use or OTC "testosterone supplementation."   Physical Exam:  BP (!) 140/82   Pulse 76   Ht  (1.778 m)   Wt 240 lb (108.9 kg)   BMI 34.44 kg/m   Constitutional:  Well nourished. Alert and oriented, No acute distress. HEENT: [place] AT, moist mucus membranes.  Trachea midline Cardiovascular: No clubbing, cyanosis, or edema. Respiratory: Normal respiratory effort, no increased work of breathing. GU: Patient with buried phallus. Foreskin easily retracted  Urethral meatus is patent.  No penile discharge. No penile lesions or rashes. Scrotum without lesions, cysts, rashes and/or edema.   Neurologic: Grossly intact, no focal deficits, moving all 4 extremities. Psychiatric: Normal mood and affect.   Procedure  Patient's left corpus cavernosum is identified.  An area near the base of the penis is cleansed with rubbing alcohol.  Careful to avoid the dorsal vein, 0.4 of Trimix (papaverine 30 mg, phentolamine 1 mg and prostaglandin E1 10 mcg, Lot # [id number]  exp # 04/20/2023 is injected at a 90 degree angle into the left  corpus  cavernosum near the base of the penis.  Patient experienced penile firmness at the base, but the tip of the penis is still soft in 15 minutes.    Assessment & Plan:    1.  Erectile dysfunction -Testosterone above normal laboratory values -He will go home and attempt intercourse with his wife this morning  -If he finds that he loses his erection or the erection is not firm enough for penetration, I advised his wife to titrate up by 0.1 with subsequent injections, but not to inject more than every other day.  I advised that 1 vial is a maximum dose and not to combined medicine from 2 different vials to increase dose.   -Advised patient of the condition of priapism, painful erection lasting for more than four hours, and to contact the office or seek treatment in the ED immediately   2. Fatigue -I explained that a cause of his fatigue is not low testosterone levels, but to speak with his primary care physician or his cardiologist to ensure that he is not having issues with the cardiovascular or pulmonary or endocrine systems causing his fatigue -He is going to call his cardiologist today    Return if symptoms worsen or fail to improve.  [name]  [facility] [address] [place], [place] [address] [phone]  I spent 30 minutes on the day of the encounter to include pre-visit record review, face-to-face time with the patient, and post-visit ordering of tests.

## 2023-03-28 ENCOUNTER — Ambulatory Visit: Payer: Medicare HMO | Admitting: Urology

## 2023-03-28 ENCOUNTER — Encounter: Payer: Self-pay | Admitting: Urology

## 2023-03-28 VITALS — BP 140/82 | HR 76 | Ht 70.0 in | Wt 240.0 lb

## 2023-03-28 DIAGNOSIS — N5201 Erectile dysfunction due to arterial insufficiency: Secondary | ICD-10-CM

## 2023-03-28 DIAGNOSIS — R5383 Other fatigue: Secondary | ICD-10-CM

## 2023-03-28 NOTE — Patient Instructions (Addendum)
TRIMIX SELF-INJECTION INSTRUCTIONS    DETAILED PROCEDURE  1. GETTING SET UP  A. Proper hygiene is important. Wash your hands and keep the penis clean.  B. Assemble the following:  - Bottle of Trimix  - Alcohol pad  - Syringe  C. Keep the Trimix cold by returning the bottle to the refrigerator, or by placing the bottle in a cup of ice.   2. PREPARE THE SYRINGE  The dose that was injected today (04/18/024) was 0.4.  If this does not achieve a satisfactory intercourse, the next injection would be 0.5, do not inject more than every other day and do not increase the dose by more than 0.1 at a time A. Wipe the rubber top of the vial with an alcohol pad.  B. After removing the cap of the needle, pull the plunger back to the desired dosage, filling this volume with air. Use a new needle and syringe each time.  C. Insert the needle through the rubber top and inject the air into the vial.  D. Turn the vial with needle and syringe inserted upside down. Pull back on the syringe plunger in a slow and steady motion until the desired dosage is achieved.  E. Tap the side of the syringe (1cc tuberculin syringe with a 29 gauge needle) to allow any air bubbles to float towards the needle. Avoid having these air bubbles in the syringe when self-injecting by first injecting out the collected bubbles that may form.  F. Remove the needle from the bottle and replace the protective cap on the needle.    3. SELECT AND PREPARE THE SITE FOR INJECTION  A. The proper location for injection is at the 9-11 and 1-3 o'clock positions, between the base and mid-portion of the penis.(see diagram) Avoid the midline because of potential for injury to the urethra (6 o'clock; for urinary passage) and the penile arteries and nerves (near 12 o'clock). Avoid any visible veins or arteries on the surface.  B. Grasp and pull the head of the penis toward the side of your leg with the index finger and thumb (use the left hand, if right  handed). While maintaining light tension, select a site for injection.  C. Clean the site with an alcohol pad.   4: INJECT TRIMIX AND APPLY COMPRESSION  A. With a steady and continuous motion, penetrate the skin with the needle at a 90 o angle. The needle should then be advanced to the hub. Slight resistance is encountered as the needle passes into the proper position within the erectile tissue (corporeal body).  B. Inject the Trimix over approximately 4 seconds. Withdraw the needle from the penis and apply compression to the injection site for approximately 1 minute. Several minutes of compression may be required to avoid bleeding, especially if you are an aspirin user.  C. Replace the cap on the needle and dispose of properly.   If you experience a painful erection that will not go down, take four (30 mg) tablets of pseudoephedrine (Sudafed-not the extended release) and if the erection does not go down in the next hour or increases in pain, contact the office immediately or seek treatment in the ED

## 2023-04-04 ENCOUNTER — Ambulatory Visit: Payer: Medicare HMO | Admitting: Urology

## 2023-05-01 ENCOUNTER — Other Ambulatory Visit: Payer: Self-pay | Admitting: Internal Medicine

## 2023-05-01 DIAGNOSIS — I10 Essential (primary) hypertension: Secondary | ICD-10-CM

## 2023-05-01 DIAGNOSIS — I2089 Other forms of angina pectoris: Secondary | ICD-10-CM

## 2023-05-08 ENCOUNTER — Telehealth (HOSPITAL_COMMUNITY): Payer: Self-pay | Admitting: Emergency Medicine

## 2023-05-08 NOTE — Telephone Encounter (Signed)
Reaching out to patient to offer assistance regarding upcoming cardiac imaging study; pt verbalizes understanding of appt date/time, parking situation and where to check in, pre-test NPO status and medications ordered, and verified current allergies; name and call back number provided for further questions should they arise Arnell Mausolf RN Navigator Cardiac Imaging [facility] [phone] office [phone] cell 

## 2023-05-09 ENCOUNTER — Ambulatory Visit
Admission: RE | Admit: 2023-05-09 | Discharge: 2023-05-09 | Disposition: A | Discharge status: Home / Self Care | Payer: Medicare HMO | Source: Ambulatory Visit | Attending: Internal Medicine | Admitting: Internal Medicine

## 2023-05-09 DIAGNOSIS — I10 Essential (primary) hypertension: Secondary | ICD-10-CM | POA: Diagnosis present

## 2023-05-09 DIAGNOSIS — I2089 Other forms of angina pectoris: Secondary | ICD-10-CM | POA: Diagnosis not present

## 2023-05-09 DIAGNOSIS — I25118 Atherosclerotic heart disease of native coronary artery with other forms of angina pectoris: Secondary | ICD-10-CM

## 2023-05-09 MED ORDER — IOHEXOL 350 MG/ML SOLN
100.0000 mL | Freq: Once | INTRAVENOUS | Status: AC | PRN
  Administered 2023-05-09: 100 mL via INTRAVENOUS

## 2023-05-09 MED ORDER — METOPROLOL TARTRATE 5 MG/5ML IV SOLN
10.0000 mg | Freq: Once | INTRAVENOUS | Status: AC
  Administered 2023-05-09: 10 mg via INTRAVENOUS

## 2023-05-09 MED ORDER — NITROGLYCERIN 0.4 MG SL SUBL
0.8000 mg | SUBLINGUAL_TABLET | Freq: Once | SUBLINGUAL | Status: AC
  Administered 2023-05-09: 0.8 mg via SUBLINGUAL

## 2023-05-09 NOTE — Progress Notes (Signed)
Pt completed procedure well. Pt ABCs intact. Pt denies any issues or complaints. Pt provided with water and encouraged to drink fluids throughout the day. Pt ambulatory with steady gait.

## 2023-06-03 ENCOUNTER — Ambulatory Visit: Admission: RE | Admit: 2023-06-03 | Payer: Medicare HMO | Source: Ambulatory Visit

## 2023-07-11 ENCOUNTER — Other Ambulatory Visit: Payer: Self-pay | Admitting: Urology

## 2023-07-11 NOTE — Telephone Encounter (Signed)
Pt stopped by office asking for a refill for Trimix.

## 2023-07-15 MED ORDER — NONFORMULARY OR COMPOUNDED ITEM: 6 refills | Status: DC

## 2023-07-16 ENCOUNTER — Telehealth: Payer: Self-pay | Admitting: Urology

## 2023-07-16 NOTE — Telephone Encounter (Signed)
Pt called office and said an RX for trimix was never sent to [facility].  He was calling to see what was going on.

## 2023-08-06 ENCOUNTER — Other Ambulatory Visit: Payer: Self-pay | Admitting: Pulmonary Disease

## 2023-08-06 DIAGNOSIS — J984 Other disorders of lung: Secondary | ICD-10-CM

## 2023-08-06 DIAGNOSIS — J849 Interstitial pulmonary disease, unspecified: Secondary | ICD-10-CM

## 2023-08-13 ENCOUNTER — Ambulatory Visit
Admission: RE | Admit: 2023-08-13 | Discharge: 2023-08-13 | Disposition: A | Discharge status: Home / Self Care | Payer: Medicare HMO | Source: Ambulatory Visit | Attending: Pulmonary Disease | Admitting: Pulmonary Disease

## 2023-08-13 DIAGNOSIS — J984 Other disorders of lung: Secondary | ICD-10-CM | POA: Diagnosis present

## 2023-08-13 DIAGNOSIS — J849 Interstitial pulmonary disease, unspecified: Secondary | ICD-10-CM | POA: Insufficient documentation

## 2023-09-24 ENCOUNTER — Telehealth: Payer: Self-pay | Admitting: Urology

## 2023-09-24 NOTE — Telephone Encounter (Signed)
Patient dropped in office and said at his previous appointment that medications had been discussed that he could try instead of injections. He said he no longer wants to do trimix injections, and would like to try another medication. He could not remember names of medications discussed. Please contact patient to discuss medication options.

## 2023-09-25 NOTE — Telephone Encounter (Signed)
Spoke with patient and advised results Pt understood

## 2023-09-25 NOTE — Telephone Encounter (Signed)
Patient would like to try a prescription of Viagra to be sent to the [facility].

## 2023-10-11 ENCOUNTER — Ambulatory Visit
Admission: RE | Admit: 2023-10-11 | Discharge: 2023-10-11 | Disposition: A | Discharge status: Home / Self Care | Payer: Medicare HMO | Attending: Internal Medicine | Admitting: Internal Medicine

## 2023-10-11 ENCOUNTER — Encounter
Admission: RE | Disposition: A | Discharge status: Home / Self Care | Payer: Self-pay | Source: Home / Self Care | Attending: Internal Medicine

## 2023-10-11 ENCOUNTER — Encounter: Payer: Self-pay | Admitting: Internal Medicine

## 2023-10-11 ENCOUNTER — Other Ambulatory Visit: Payer: Self-pay

## 2023-10-11 DIAGNOSIS — I272 Pulmonary hypertension, unspecified: Secondary | ICD-10-CM | POA: Diagnosis present

## 2023-10-11 DIAGNOSIS — Z951 Presence of aortocoronary bypass graft: Secondary | ICD-10-CM | POA: Diagnosis not present

## 2023-10-11 DIAGNOSIS — I2582 Chronic total occlusion of coronary artery: Secondary | ICD-10-CM | POA: Insufficient documentation

## 2023-10-11 DIAGNOSIS — I25119 Atherosclerotic heart disease of native coronary artery with unspecified angina pectoris: Secondary | ICD-10-CM | POA: Insufficient documentation

## 2023-10-11 DIAGNOSIS — I2510 Atherosclerotic heart disease of native coronary artery without angina pectoris: Secondary | ICD-10-CM | POA: Diagnosis not present

## 2023-10-11 HISTORY — PX: RIGHT/LEFT HEART CATH AND CORONARY ANGIOGRAPHY: CATH118266

## 2023-10-11 LAB — POCT I-STAT EG7
Acid-Base Excess: 1 mmol/L (ref 0.0–2.0)
Bicarbonate: 26.2 mmol/L (ref 20.0–28.0)
Calcium, Ion: 1.24 mmol/L (ref 1.15–1.40)
HCT: 44 % (ref 39.0–52.0)
Hemoglobin: 15 g/dL (ref 13.0–17.0)
O2 Saturation: 65 %
Potassium: 4.2 mmol/L (ref 3.5–5.1)
Sodium: 138 mmol/L (ref 135–145)
TCO2: 28 mmol/L (ref 22–32)
pCO2, Ven: 45.1 mm[Hg] (ref 44–60)
pH, Ven: 7.373 (ref 7.25–7.43)
pO2, Ven: 35 mm[Hg] (ref 32–45)

## 2023-10-11 LAB — POCT I-STAT 7, (LYTES, BLD GAS, ICA,H+H)
Acid-Base Excess: 0 mmol/L (ref 0.0–2.0)
Bicarbonate: 25.2 mmol/L (ref 20.0–28.0)
Calcium, Ion: 1.22 mmol/L (ref 1.15–1.40)
HCT: 43 % (ref 39.0–52.0)
Hemoglobin: 14.6 g/dL (ref 13.0–17.0)
O2 Saturation: 94 %
Potassium: 4.2 mmol/L (ref 3.5–5.1)
Sodium: 139 mmol/L (ref 135–145)
TCO2: 26 mmol/L (ref 22–32)
pCO2 arterial: 41 mm[Hg] (ref 32–48)
pH, Arterial: 7.397 (ref 7.35–7.45)
pO2, Arterial: 72 mm[Hg] — ABNORMAL LOW (ref 83–108)

## 2023-10-11 SURGERY — RIGHT/LEFT HEART CATH AND CORONARY ANGIOGRAPHY
Anesthesia: Moderate Sedation | Laterality: Bilateral

## 2023-10-11 MED ORDER — MIDAZOLAM HCL 2 MG/2ML IJ SOLN
INTRAMUSCULAR | Status: AC
  Filled 2023-10-11: qty 2

## 2023-10-11 MED ORDER — SODIUM CHLORIDE 0.9 % WEIGHT BASED INFUSION: 1.0000 mL/kg/h | INTRAVENOUS | Status: DC

## 2023-10-11 MED ORDER — FENTANYL CITRATE (PF) 100 MCG/2ML IJ SOLN
INTRAMUSCULAR | Status: AC
  Filled 2023-10-11: qty 2

## 2023-10-11 MED ORDER — SODIUM CHLORIDE 0.9 % IV SOLN
INTRAVENOUS | Status: DC | PRN
  Administered 2023-10-11: 100 mL via INTRAVENOUS

## 2023-10-11 MED ORDER — LIDOCAINE HCL (PF) 1 % IJ SOLN
INTRAMUSCULAR | Status: DC | PRN
  Administered 2023-10-11: 10 mL

## 2023-10-11 MED ORDER — ASPIRIN 81 MG PO CHEW
81.0000 mg | CHEWABLE_TABLET | ORAL | Status: AC
  Administered 2023-10-11: 81 mg via ORAL

## 2023-10-11 MED ORDER — SODIUM CHLORIDE 0.9% FLUSH: 3.0000 mL | Freq: Two times a day (BID) | INTRAVENOUS | Status: DC

## 2023-10-11 MED ORDER — MIDAZOLAM HCL 2 MG/2ML IJ SOLN
INTRAMUSCULAR | Status: DC | PRN
  Administered 2023-10-11: 1 mg via INTRAVENOUS

## 2023-10-11 MED ORDER — ACETAMINOPHEN 325 MG PO TABS: 650.0000 mg | ORAL_TABLET | ORAL | Status: DC | PRN

## 2023-10-11 MED ORDER — IOHEXOL 300 MG/ML  SOLN
INTRAMUSCULAR | Status: DC | PRN
  Administered 2023-10-11: 105 mL

## 2023-10-11 MED ORDER — HEPARIN (PORCINE) IN NACL 1000-0.9 UT/500ML-% IV SOLN
INTRAVENOUS | Status: AC
  Filled 2023-10-11: qty 1000

## 2023-10-11 MED ORDER — SODIUM CHLORIDE 0.9% FLUSH: 3.0000 mL | INTRAVENOUS | Status: DC | PRN

## 2023-10-11 MED ORDER — SODIUM CHLORIDE 0.9 % IV SOLN: 250.0000 mL | INTRAVENOUS | Status: DC | PRN

## 2023-10-11 MED ORDER — SODIUM CHLORIDE 0.9 % IV SOLN: INTRAVENOUS | Status: DC

## 2023-10-11 MED ORDER — HEPARIN (PORCINE) IN NACL 1000-0.9 UT/500ML-% IV SOLN
INTRAVENOUS | Status: DC | PRN
  Administered 2023-10-11 (×2): 500 mL

## 2023-10-11 MED ORDER — ASPIRIN 81 MG PO CHEW
CHEWABLE_TABLET | ORAL | Status: AC
  Filled 2023-10-11: qty 1

## 2023-10-11 MED ORDER — FENTANYL CITRATE (PF) 100 MCG/2ML IJ SOLN
INTRAMUSCULAR | Status: DC | PRN
  Administered 2023-10-11: 25 ug via INTRAVENOUS

## 2023-10-11 SURGICAL SUPPLY — 13 items
CATH INFINITI 5FR MULTPACK ANG (CATHETERS) IMPLANT
CATH SWAN GANZ 7F STRAIGHT (CATHETERS) IMPLANT
DEVICE CLOSURE MYNXGRIP 5F (Vascular Products) IMPLANT
GUIDEWIRE EMER 3M J .025X150CM (WIRE) IMPLANT
NDL PERC 18GX7CM (NEEDLE) IMPLANT
NEEDLE PERC 18GX7CM (NEEDLE) ×1 IMPLANT
PACK CARDIAC CATH (CUSTOM PROCEDURE TRAY) ×1 IMPLANT
PROTECTION STATION PRESSURIZED (MISCELLANEOUS) ×1
SET ATX-X65L (MISCELLANEOUS) IMPLANT
SHEATH AVANTI 5FR X 11CM (SHEATH) IMPLANT
SHEATH AVANTI 7FRX11 (SHEATH) IMPLANT
STATION PROTECTION PRESSURIZED (MISCELLANEOUS) IMPLANT
WIRE GUIDERIGHT .035X150 (WIRE) IMPLANT

## 2023-10-11 NOTE — CV Procedure (Signed)
Brief outpatient diagnostic cardiac cath because of known coronary disease angina positive imaging study shortness of breath  Right groin approach Right and left heart  7 French sheath right femoral vein  5 French sheath right femoral artery Both performed under ultrasound guidance after 20 cc of 1% lidocaine used anesthetize the area  Right heart Mean wedge 3/3 mean of 0 PA mean 28/4 mean of 13 RV 29/11 RVEDP 5 RA 3/0 mean of 0  LV 113/69 LVEDP 13 AO 18/61 MAP of 87  Ao sat 94% PA sat 65%  Cardiac output 4.5 Fick  Left ventriculogram Normal left ventricular function EF of 50 to 55%  Coronaries Left main large minor irregularities LAD 100% occlusion proximally Circumflex 100% occlusion ostially RCA 100% occluded proximally  Grafts SVG mid LAD widely patent SVG diagonal 1 widely patent SVG to circumflex system widely patent SVG to distal RCA widely patent  Benign right heart cath no pulmonary hypertension Severe multivessel coronary artery disease native vessels Widely patent bypass grafts  Left ventricular function EF around 50 to 55%  Intervention deferred Not indicated Patient tolerated procedure well

## 2023-10-12 LAB — CARDIAC CATHETERIZATION: Cath EF Quantitative: 55 %

## 2023-10-14 ENCOUNTER — Encounter: Payer: Self-pay | Admitting: Internal Medicine

## 2023-12-16 ENCOUNTER — Encounter: Payer: Self-pay | Admitting: Ophthalmology

## 2023-12-16 NOTE — Anesthesia Preprocedure Evaluation (Addendum)
 Anesthesia Evaluation  Patient identified by MRN, date of birth, ID band Patient awake    Reviewed: Allergy & Precautions, H&P , NPO status , reviewed documented beta blocker date and time   Airway Mallampati: II  TM Distance: >3 FB Neck ROM: full    Dental  (+) Upper Dentures, Lower Dentures   Pulmonary former smoker   Pulmonary exam normal        Cardiovascular hypertension, + CAD, + CABG and +CHF  Normal cardiovascular exam+ Valvular Problems/Murmurs   10-11-23 heart cath   Prox RCA lesion is 100% stenosed.   Ost Cx lesion is 100% stenosed.   Dist LM to Prox LAD lesion is 50% stenosed.   Mid LAD-1 lesion is 100% stenosed.   Mid RCA to Dist RCA lesion is 100% stenosed.   Mid LAD-2 lesion is 100% stenosed.   1st Diag lesion is 100% stenosed.   The left ventricular systolic function is normal.   LV end diastolic pressure is normal.   The left ventricular ejection fraction is 55-65% by visual estimate.   There is no mitral valve regurgitation.   on 10/11/2023.   Recommend Aspirin  81mg  daily for moderate CAD.   Outpatient diagnostic cardiac cath right groin approach angina multivessel coronary disease coronary bypass surgery positive imaging study   Left ventriculogram showed normal left ventricular function EF of 55%   Coronaries Left main diffuse disease 50% LAD diffuse disease ostially 75% 100% mid CTO Circumflex 100% ostial CTO RCA 100% proximal CTO   Grafts SVG to mid LAD widely patent SVG to diagonal 1 widely patent SVG to circumflex widely patent SVG to the distal RCA widely patent    Neuro/Psych  Neuromuscular disease    GI/Hepatic   Endo/Other  Hypothyroidism    Renal/GU      Musculoskeletal  (+) Arthritis ,    Abdominal   Peds  Hematology   Anesthesia Other Findings Echo on 6/24:  MILD LV SYSTOLIC DYSFUNCTION (See above)  NORMAL RIGHT VENTRICULAR SYSTOLIC FUNCTION  MILD  VALVULAR REGURGITATION (See above)  NO VALVULAR STENOSIS  MILD TR, PR  EF 40-45%    Parkinsons Mitral valve prolapse HTN (hypertension) CAD (coronary artery disease) Arthritis Atherosclerosis Dizziness Hx CABG   Reproductive/Obstetrics                             Anesthesia Physical Anesthesia Plan  ASA: 3  Anesthesia Plan: MAC   Post-op Pain Management: Minimal or no pain anticipated   Induction: Intravenous  PONV Risk Score and Plan: 2 and Treatment may vary due to age or medical condition, TIVA and Midazolam   Airway Management Planned: Nasal Cannula  Additional Equipment: None  Intra-op Plan:   Post-operative Plan:   Informed Consent: I have reviewed the patients History and Physical, chart, labs and discussed the procedure including the risks, benefits and alternatives for the proposed anesthesia with the patient or authorized representative who has indicated his/her understanding and acceptance.     Dental Advisory Given  Plan Discussed with: CRNA  Anesthesia Plan Comments: (Discussed risks of anesthesia with patient, including possibility of difficulty with spontaneous ventilation under anesthesia necessitating airway intervention, PONV, and rare risks such as cardiac or respiratory or neurological events, and allergic reactions. Discussed the role of CRNA in patient's perioperative care. Patient understands.)        Anesthesia Quick Evaluation

## 2023-12-17 ENCOUNTER — Encounter: Payer: Self-pay | Admitting: Ophthalmology

## 2023-12-19 NOTE — Discharge Instructions (Signed)

## 2023-12-23 ENCOUNTER — Ambulatory Visit: Payer: Medicare HMO | Admitting: Anesthesiology

## 2023-12-23 ENCOUNTER — Ambulatory Visit
Admission: RE | Admit: 2023-12-23 | Discharge: 2023-12-23 | Disposition: A | Discharge status: Home / Self Care | Payer: Medicare HMO | Attending: Ophthalmology | Admitting: Ophthalmology

## 2023-12-23 ENCOUNTER — Other Ambulatory Visit: Payer: Self-pay

## 2023-12-23 ENCOUNTER — Encounter
Admission: RE | Disposition: A | Discharge status: Home / Self Care | Payer: Self-pay | Source: Home / Self Care | Attending: Ophthalmology

## 2023-12-23 ENCOUNTER — Encounter: Payer: Self-pay | Admitting: Ophthalmology

## 2023-12-23 DIAGNOSIS — Z87891 Personal history of nicotine dependence: Secondary | ICD-10-CM | POA: Diagnosis not present

## 2023-12-23 DIAGNOSIS — I10 Essential (primary) hypertension: Secondary | ICD-10-CM | POA: Insufficient documentation

## 2023-12-23 DIAGNOSIS — Z951 Presence of aortocoronary bypass graft: Secondary | ICD-10-CM | POA: Insufficient documentation

## 2023-12-23 DIAGNOSIS — I341 Nonrheumatic mitral (valve) prolapse: Secondary | ICD-10-CM | POA: Insufficient documentation

## 2023-12-23 DIAGNOSIS — I2510 Atherosclerotic heart disease of native coronary artery without angina pectoris: Secondary | ICD-10-CM | POA: Insufficient documentation

## 2023-12-23 DIAGNOSIS — H2512 Age-related nuclear cataract, left eye: Secondary | ICD-10-CM | POA: Diagnosis present

## 2023-12-23 HISTORY — PX: CATARACT EXTRACTION W/PHACO: SHX586

## 2023-12-23 HISTORY — DX: Presence of aortocoronary bypass graft: Z95.1

## 2023-12-23 HISTORY — DX: Presence of dental prosthetic device (complete) (partial): Z97.2

## 2023-12-23 HISTORY — DX: Pulmonary hypertension, unspecified: I27.20

## 2023-12-23 HISTORY — DX: Presence of external hearing-aid: Z97.4

## 2023-12-23 SURGERY — PHACOEMULSIFICATION, CATARACT, WITH IOL INSERTION
Anesthesia: Monitor Anesthesia Care | Site: Eye | Laterality: Left

## 2023-12-23 MED ORDER — LIDOCAINE HCL (PF) 2 % IJ SOLN
INTRAOCULAR | Status: DC | PRN
  Administered 2023-12-23: 1 mL via INTRAOCULAR

## 2023-12-23 MED ORDER — ARMC OPHTHALMIC DILATING DROPS
OPHTHALMIC | Status: AC
  Filled 2023-12-23: qty 0.5

## 2023-12-23 MED ORDER — SIGHTPATH DOSE#1 BSS IO SOLN
INTRAOCULAR | Status: DC | PRN
  Administered 2023-12-23: 90 mL via OPHTHALMIC

## 2023-12-23 MED ORDER — MOXIFLOXACIN HCL 0.5 % OP SOLN
OPHTHALMIC | Status: DC | PRN
  Administered 2023-12-23: .2 mL via OPHTHALMIC

## 2023-12-23 MED ORDER — FENTANYL CITRATE (PF) 100 MCG/2ML IJ SOLN
INTRAMUSCULAR | Status: DC | PRN
  Administered 2023-12-23: 25 ug via INTRAVENOUS

## 2023-12-23 MED ORDER — SIGHTPATH DOSE#1 NA HYALUR & NA CHOND-NA HYALUR IO KIT
PACK | INTRAOCULAR | Status: DC | PRN
  Administered 2023-12-23: 1 via OPHTHALMIC

## 2023-12-23 MED ORDER — MIDAZOLAM HCL 2 MG/2ML IJ SOLN
INTRAMUSCULAR | Status: AC
Start: 2023-12-23 — End: ?
  Filled 2023-12-23: qty 2

## 2023-12-23 MED ORDER — SIGHTPATH DOSE#1 BSS IO SOLN
INTRAOCULAR | Status: DC | PRN
  Administered 2023-12-23: 15 mL

## 2023-12-23 MED ORDER — MIDAZOLAM HCL 2 MG/2ML IJ SOLN
INTRAMUSCULAR | Status: DC | PRN
  Administered 2023-12-23: 1 mg via INTRAVENOUS

## 2023-12-23 MED ORDER — TETRACAINE HCL 0.5 % OP SOLN
OPHTHALMIC | Status: AC
  Filled 2023-12-23: qty 4

## 2023-12-23 MED ORDER — FENTANYL CITRATE (PF) 100 MCG/2ML IJ SOLN
INTRAMUSCULAR | Status: AC
  Filled 2023-12-23: qty 2

## 2023-12-23 MED ORDER — TETRACAINE HCL 0.5 % OP SOLN
1.0000 [drp] | OPHTHALMIC | Status: DC | PRN
  Administered 2023-12-23 (×3): 1 [drp] via OPHTHALMIC

## 2023-12-23 MED ORDER — ARMC OPHTHALMIC DILATING DROPS
1.0000 | OPHTHALMIC | Status: DC | PRN
  Administered 2023-12-23 (×3): 1 via OPHTHALMIC

## 2023-12-23 SURGICAL SUPPLY — 17 items
CANNULA ANT/CHMB 27G (MISCELLANEOUS) IMPLANT
CANNULA ANT/CHMB 27GA (MISCELLANEOUS) IMPLANT
CATARACT SUITE SIGHTPATH (MISCELLANEOUS) ×1 IMPLANT
DISSECTOR HYDRO NUCLEUS 50X22 (MISCELLANEOUS) ×1 IMPLANT
FEE CATARACT SUITE SIGHTPATH (MISCELLANEOUS) ×1 IMPLANT
GLOVE PI ULTRA LF STRL 7.5 (GLOVE) ×1 IMPLANT
GLOVE SURG POLYISOPRENE 8.5 (GLOVE) ×1 IMPLANT
GLOVE SURG SYN 8.5 PF PI BL (GLOVE) ×1 IMPLANT
LENS IOL TECNIS EYHANCE 22.5 (Intraocular Lens) IMPLANT
NDL FILTER BLUNT 18X1 1/2 (NEEDLE) ×1 IMPLANT
NEEDLE FILTER BLUNT 18X1 1/2 (NEEDLE) ×1 IMPLANT
PACK VIT ANT 23G (MISCELLANEOUS) IMPLANT
RING MALYGIN (MISCELLANEOUS) IMPLANT
SUT ETHILON 10-0 CS-B-6CS-B-6 (SUTURE)
SUTURE EHLN 10-0 CS-B-6CS-B-6 (SUTURE) IMPLANT
SYR 3ML LL SCALE MARK (SYRINGE) ×1 IMPLANT
SYR 5ML LL (SYRINGE) ×1 IMPLANT

## 2023-12-23 NOTE — Anesthesia Postprocedure Evaluation (Signed)
 Anesthesia Post Note  Patient: Thomas Hood  Procedure(s) Performed: CATARACT EXTRACTION PHACO AND INTRAOCULAR LENS PLACEMENT (IOC) LEFT (Left: Eye)  Patient location during evaluation: PACU Anesthesia Type: MAC Level of consciousness: awake and alert Pain management: pain level controlled Vital Signs Assessment: post-procedure vital signs reviewed and stable Respiratory status: spontaneous breathing, nonlabored ventilation, respiratory function stable and patient connected to nasal cannula oxygen  Cardiovascular status: blood pressure returned to baseline and stable Postop Assessment: no apparent nausea or vomiting Anesthetic complications: no  No notable events documented.   Last Vitals:  Vitals:   12/23/23 0854 12/23/23 0855  BP:  102/84  Pulse: 69 63  Resp: 17 15  Temp: 36.8 C   SpO2: 96% 95%    Last Pain:  Vitals:   12/23/23 0741  TempSrc: Temporal  PainSc: 0-No pain                 Debby Mines

## 2023-12-23 NOTE — H&P (Signed)
 [facility]   Primary Care Physician:  Britta Kelven Flater, MD Ophthalmologist: Dr. Adine Novak  Pre-Procedure History & Physical: HPI:  Thomas Hood is a 68 y.o. male here for cataract surgery.   Past Medical History:  Diagnosis Date   Arthritis    Atherosclerosis    CAD (coronary artery disease)    Dizziness    HTN (hypertension)    Mitral valve prolapse    Pulmonary hypertension (HCC)    S/P CABG x 4    Wears dentures    full upper and lower   Wears hearing aid in both ears     Past Surgical History:  Procedure Laterality Date   CHOLESTEATOMA EXCISION Right    COLONOSCOPY     3-4 yrs. ago  Hx polyps   COLONOSCOPY WITH PROPOFOL  N/A 05/01/2018   Procedure: COLONOSCOPY WITH PROPOFOL ;  Surgeon: Janalyn Keene NOVAK, MD;  Location: [facility] ENDOSCOPY;  Service: Endoscopy;  Laterality: N/A;   CORONARY ARTERY BYPASS GRAFT     ESOPHAGOGASTRODUODENOSCOPY (EGD) WITH PROPOFOL  N/A 05/01/2018   Procedure: ESOPHAGOGASTRODUODENOSCOPY (EGD) WITH PROPOFOL ;  Surgeon: Janalyn Keene NOVAK, MD;  Location: [facility] ENDOSCOPY;  Service: Endoscopy;  Laterality: N/A;   RIGHT/LEFT HEART CATH AND CORONARY ANGIOGRAPHY Bilateral 10/11/2023   Procedure: RIGHT/LEFT HEART CATH AND CORONARY ANGIOGRAPHY;  Surgeon: Florencio Cara BIRCH, MD;  Location: [facility] INVASIVE CV LAB;  Service: Cardiovascular;  Laterality: Bilateral;   TYMPANOPLASTY W/ MASTOIDECTOMY  08/23/2015    Prior to Admission medications   Medication Sig Start Date End Date Taking? Authorizing Provider  aspirin  EC 81 MG tablet Take 81 mg by mouth daily. Swallow whole.   Yes [provider]  atorvastatin  (LIPITOR) 40 MG tablet Take 1 tablet (40 mg total) by mouth daily. for cholesterol. Patient taking differently: Take 40 mg by mouth every evening. for cholesterol. 08/01/22  Yes Masoud, Javed, MD  cetirizine (ZYRTEC) 10 MG tablet Take 10 mg by mouth at bedtime.   Yes [provider]  fluticasone (FLONASE) 50 MCG/ACT nasal spray Place  into both nostrils daily.   Yes [provider]  gabapentin (NEURONTIN) 300 MG capsule Take 300 mg by mouth daily as needed (restless leg syndrome).   Yes [provider]  levothyroxine  (SYNTHROID ) 50 MCG tablet Take 1 tablet (50 mcg total) by mouth daily. Patient taking differently: Take 50 mcg by mouth at bedtime. 08/01/22  Yes Masoud, Javed, MD  lisinopril (ZESTRIL) 5 MG tablet Take 5 mg by mouth every evening. 07/22/23 07/21/24 Yes [provider]  metoprolol  tartrate (LOPRESSOR ) 25 MG tablet TAKE (1) TABLET BY MOUTH EVERY TWELVE HOURS. Patient taking differently: Take 25 mg by mouth at bedtime. 08/01/22  Yes Masoud, Kaelum Kissick, MD  rOPINIRole (REQUIP) 0.25 MG tablet Take 0.25 mg by mouth 3 (three) times daily. 07/26/23  Yes [provider]  sildenafil  (VIAGRA ) 100 MG tablet Take 100 mg by mouth daily as needed for erectile dysfunction.   Yes [provider]  torsemide (DEMADEX) 20 MG tablet Take 20 mg by mouth every 3 (three) days. 09/03/23 09/02/24 Yes [provider]    Allergies as of 10/21/2023   (No Known Allergies)    Family History  Problem Relation Age of Onset   Cancer Mother        colon   Cancer Sister        lung cancer-hx smoking   Heart failure Sister     Social History   Socioeconomic History   Marital status: Single    Spouse  name: Not on file   Number of children: Not on file   Years of education: Not on file   Highest education level: Not on file  Occupational History   Not on file  Tobacco Use   Smoking status: Former    Current packs/day: 0.00    Average packs/day: 1 pack/day for 20.0 years (20.0 ttl pk-yrs)    Types: Cigarettes    Start date: 41    Quit date: 42    Years since quitting: 35.0   Smokeless tobacco: Never  Vaping Use   Vaping status: Never Used  Substance and Sexual Activity   Alcohol use: Not Currently    Comment: occ.   Drug use: Never   Sexual activity: Not on file  Other Topics  Concern   Not on file  Social History Narrative   Not on file   Social Drivers of Health   Financial Resource Strain: Low Risk  (08/01/2023)   Received from [facility]   Overall Financial Resource Strain (CARDIA)    Difficulty of Paying Living Expenses: Not hard at all  Food Insecurity: No Food Insecurity (08/01/2023)   Received from [facility]   Hunger Vital Sign    Worried About Running Out of Food in the Last Year: Never true    Ran Out of Food in the Last Year: Never true  Transportation Needs: No Transportation Needs (08/01/2023)   Received from [facility] - Transportation    In the past 12 months, has lack of transportation kept you from medical appointments or from getting medications?: No    Lack of Transportation (Non-Medical): No  Physical Activity: Sufficiently Active (11/10/2021)   Exercise Vital Sign    Days of Exercise per Week: 4 days    Minutes of Exercise per Session: 40 min  Stress: No Stress Concern Present (11/10/2021)   [employer] of Occupational Health - Occupational Stress Questionnaire    Feeling of Stress : Not at all  Social Connections: Moderately Integrated (11/10/2021)   Social Connection and Isolation Panel [NHANES]    Frequency of Communication with Friends and Family: More than three times a week    Frequency of Social Gatherings with Friends and Family: More than three times a week    Attends Religious Services: Never    Database Administrator or Organizations: No    Attends Engineer, Structural: More than 4 times per year    Marital Status: Married  Catering Manager Violence: Not At Risk (11/10/2021)   Humiliation, Afraid, Rape, and Kick questionnaire    Fear of Current or Ex-Partner: No    Emotionally Abused: No    Physically Abused: No    Sexually Abused: No    Review of Systems: See HPI, otherwise negative ROS  Physical Exam: BP 138/83   Pulse 66   Temp  97.9 F (36.6 C) (Temporal)   Resp 19   Ht 5' 5 (1.651 m)   Wt 105.6 kg   SpO2 97%   BMI 38.72 kg/m  General:   Alert, cooperative in NAD Head:  Normocephalic and atraumatic. Respiratory:  Normal work of breathing. Cardiovascular:  RRR  Impression/Plan: Thomas Hood is here for cataract surgery.  Risks, benefits, limitations, and alternatives regarding cataract surgery have been reviewed with the patient.  Questions have been answered.  All parties agreeable.   Adine Novak, MD  12/23/2023, 8:23 AM

## 2023-12-23 NOTE — Addendum Note (Signed)
 Addendum  created 12/23/23 0948 by Stephanie Coup, MD   Attestation recorded in Intraprocedure, Intraprocedure Attestations filed

## 2023-12-23 NOTE — Op Note (Signed)
 OPERATIVE NOTE  TRESHON STANNARD [id number] 12/23/2023   PREOPERATIVE DIAGNOSIS:  Nuclear sclerotic cataract left eye.  H25.12   POSTOPERATIVE DIAGNOSIS:    Nuclear sclerotic cataract left eye.     PROCEDURE:  Phacoemusification with posterior chamber intraocular lens placement of the left eye   LENS:   Implant Name Type Inv. Item Serial No. Manufacturer Lot No. LRB No. Used Action  LENS IOL TECNIS EYHANCE 22.5 - [id number] Intraocular Lens LENS IOL TECNIS EYHANCE 22.5 [id number] [employer]  Left 1 Implanted      Procedure(s) with comments: CATARACT EXTRACTION PHACO AND INTRAOCULAR LENS PLACEMENT (IOC) LEFT (Left) - 2.82 0:27.7  IPA99+77.4  SURGEON:  Adine Novak, MD, MPH   ANESTHESIA:  Topical with tetracaine  drops augmented with 1% preservative-free intracameral lidocaine .  ESTIMATED BLOOD LOSS: <1 mL   COMPLICATIONS:  None.   DESCRIPTION OF PROCEDURE:  The patient was identified in the holding room and transported to the operating room and placed in the supine position under the operating microscope.  The left eye was identified as the operative eye and it was prepped and draped in the usual sterile ophthalmic fashion.   A 1.0 millimeter clear-corneal paracentesis was made at the 5:00 position. 0.5 ml of preservative-free 1% lidocaine  with epinephrine  was injected into the anterior chamber.  The anterior chamber was filled with viscoelastic.  A 2.4 millimeter keratome was used to make a near-clear corneal incision at the 2:00 position.  A curvilinear capsulorrhexis was made with a cystotome and capsulorrhexis forceps.  Balanced salt  solution was used to hydrodissect and hydrodelineate the nucleus.   Phacoemulsification was then used in stop and chop fashion to remove the lens nucleus and epinucleus.  The remaining cortex was then removed using the irrigation and aspiration handpiece. Viscoelastic was then placed into the capsular bag to distend it for lens placement.  A lens was  then injected into the capsular bag.  The remaining viscoelastic was aspirated.   Wounds were hydrated with balanced salt  solution.  The anterior chamber was inflated to a physiologic pressure with balanced salt  solution.  Intracameral vigamox  0.1 mL undiltued was injected into the eye and a drop placed onto the ocular surface.  No wound leaks were noted.  The patient was taken to the recovery room in stable condition without complications of anesthesia or surgery  Adine Novak 12/23/2023, 8:51 AM

## 2023-12-23 NOTE — Transfer of Care (Signed)
 Immediate Anesthesia Transfer of Care Note  Patient: Thomas Hood  Procedure(s) Performed: CATARACT EXTRACTION PHACO AND INTRAOCULAR LENS PLACEMENT (IOC) LEFT (Left: Eye)  Patient Location: PACU  Anesthesia Type: MAC  Level of Consciousness: awake, alert  and patient cooperative  Airway and Oxygen  Therapy: Patient Spontanous Breathing and Patient connected to supplemental oxygen   Post-op Assessment: Post-op Vital signs reviewed, Patient's Cardiovascular Status Stable, Respiratory Function Stable, Patent Airway and No signs of Nausea or vomiting  Post-op Vital Signs: Reviewed and stable  Complications: No notable events documented.

## 2023-12-24 ENCOUNTER — Encounter: Payer: Self-pay | Admitting: Ophthalmology

## 2024-01-02 NOTE — Discharge Instructions (Signed)

## 2024-01-06 ENCOUNTER — Ambulatory Visit: Payer: Medicare HMO | Admitting: Anesthesiology

## 2024-01-06 ENCOUNTER — Encounter
Admission: RE | Disposition: A | Discharge status: Home / Self Care | Payer: Self-pay | Source: Home / Self Care | Attending: Ophthalmology

## 2024-01-06 ENCOUNTER — Ambulatory Visit
Admission: RE | Admit: 2024-01-06 | Discharge: 2024-01-06 | Disposition: A | Discharge status: Home / Self Care | Payer: Medicare HMO | Attending: Ophthalmology | Admitting: Ophthalmology

## 2024-01-06 ENCOUNTER — Encounter: Payer: Self-pay | Admitting: Ophthalmology

## 2024-01-06 ENCOUNTER — Other Ambulatory Visit: Payer: Self-pay

## 2024-01-06 DIAGNOSIS — H2511 Age-related nuclear cataract, right eye: Secondary | ICD-10-CM | POA: Diagnosis present

## 2024-01-06 DIAGNOSIS — I110 Hypertensive heart disease with heart failure: Secondary | ICD-10-CM | POA: Diagnosis not present

## 2024-01-06 DIAGNOSIS — I2510 Atherosclerotic heart disease of native coronary artery without angina pectoris: Secondary | ICD-10-CM | POA: Diagnosis not present

## 2024-01-06 DIAGNOSIS — G709 Myoneural disorder, unspecified: Secondary | ICD-10-CM | POA: Insufficient documentation

## 2024-01-06 DIAGNOSIS — E039 Hypothyroidism, unspecified: Secondary | ICD-10-CM | POA: Diagnosis not present

## 2024-01-06 DIAGNOSIS — M1990 Unspecified osteoarthritis, unspecified site: Secondary | ICD-10-CM | POA: Diagnosis not present

## 2024-01-06 DIAGNOSIS — Z87891 Personal history of nicotine dependence: Secondary | ICD-10-CM | POA: Diagnosis not present

## 2024-01-06 DIAGNOSIS — Z951 Presence of aortocoronary bypass graft: Secondary | ICD-10-CM | POA: Diagnosis not present

## 2024-01-06 DIAGNOSIS — I509 Heart failure, unspecified: Secondary | ICD-10-CM | POA: Diagnosis not present

## 2024-01-06 HISTORY — PX: CATARACT EXTRACTION W/PHACO: SHX586

## 2024-01-06 SURGERY — PHACOEMULSIFICATION, CATARACT, WITH IOL INSERTION
Anesthesia: Monitor Anesthesia Care | Site: Eye | Laterality: Right

## 2024-01-06 MED ORDER — SIGHTPATH DOSE#1 NA HYALUR & NA CHOND-NA HYALUR IO KIT
PACK | INTRAOCULAR | Status: DC | PRN
  Administered 2024-01-06: 1 via OPHTHALMIC

## 2024-01-06 MED ORDER — TETRACAINE HCL 0.5 % OP SOLN
1.0000 [drp] | OPHTHALMIC | Status: DC | PRN
  Administered 2024-01-06 (×3): 1 [drp] via OPHTHALMIC

## 2024-01-06 MED ORDER — LIDOCAINE HCL (PF) 2 % IJ SOLN
INTRAOCULAR | Status: DC | PRN
  Administered 2024-01-06: 1 mL via INTRAOCULAR

## 2024-01-06 MED ORDER — TETRACAINE HCL 0.5 % OP SOLN
OPHTHALMIC | Status: AC
Start: 2024-01-06 — End: ?
  Filled 2024-01-06: qty 4

## 2024-01-06 MED ORDER — MIDAZOLAM HCL 2 MG/2ML IJ SOLN
INTRAMUSCULAR | Status: AC
  Filled 2024-01-06: qty 2

## 2024-01-06 MED ORDER — MOXIFLOXACIN HCL 0.5 % OP SOLN
OPHTHALMIC | Status: DC | PRN
  Administered 2024-01-06: .2 mL via OPHTHALMIC

## 2024-01-06 MED ORDER — MIDAZOLAM HCL 2 MG/2ML IJ SOLN
INTRAMUSCULAR | Status: DC | PRN
  Administered 2024-01-06: 1 mg via INTRAVENOUS

## 2024-01-06 MED ORDER — SIGHTPATH DOSE#1 BSS IO SOLN
INTRAOCULAR | Status: DC | PRN
  Administered 2024-01-06: 15 mL

## 2024-01-06 MED ORDER — ARMC OPHTHALMIC DILATING DROPS
OPHTHALMIC | Status: AC
  Filled 2024-01-06: qty 0.5

## 2024-01-06 MED ORDER — FENTANYL CITRATE (PF) 100 MCG/2ML IJ SOLN
INTRAMUSCULAR | Status: AC
  Filled 2024-01-06: qty 2

## 2024-01-06 MED ORDER — FENTANYL CITRATE (PF) 100 MCG/2ML IJ SOLN
INTRAMUSCULAR | Status: DC | PRN
  Administered 2024-01-06: 50 ug via INTRAVENOUS

## 2024-01-06 MED ORDER — ONDANSETRON HCL 4 MG/2ML IJ SOLN: 4.0000 mg | Freq: Once | INTRAMUSCULAR | Status: DC | PRN

## 2024-01-06 MED ORDER — ARMC OPHTHALMIC DILATING DROPS
1.0000 | OPHTHALMIC | Status: DC | PRN
  Administered 2024-01-06 (×3): 1 via OPHTHALMIC

## 2024-01-06 MED ORDER — SIGHTPATH DOSE#1 BSS IO SOLN
INTRAOCULAR | Status: DC | PRN
  Administered 2024-01-06: 83 mL via OPHTHALMIC

## 2024-01-06 SURGICAL SUPPLY — 17 items
CANNULA ANT/CHMB 27G (MISCELLANEOUS) IMPLANT
CANNULA ANT/CHMB 27GA (MISCELLANEOUS) IMPLANT
CATARACT SUITE SIGHTPATH (MISCELLANEOUS) ×1 IMPLANT
DISSECTOR HYDRO NUCLEUS 50X22 (MISCELLANEOUS) ×1 IMPLANT
FEE CATARACT SUITE SIGHTPATH (MISCELLANEOUS) ×1 IMPLANT
GLOVE PI ULTRA LF STRL 7.5 (GLOVE) ×1 IMPLANT
GLOVE SURG POLYISOPRENE 8.5 (GLOVE) ×1 IMPLANT
GLOVE SURG SYN 8.5 PF PI BL (GLOVE) ×1 IMPLANT
LENS IOL TECNIS EYHANCE 22.0 (Intraocular Lens) IMPLANT
NDL FILTER BLUNT 18X1 1/2 (NEEDLE) ×1 IMPLANT
NEEDLE FILTER BLUNT 18X1 1/2 (NEEDLE) ×1 IMPLANT
PACK VIT ANT 23G (MISCELLANEOUS) IMPLANT
RING MALYGIN (MISCELLANEOUS) IMPLANT
SUT ETHILON 10-0 CS-B-6CS-B-6 (SUTURE)
SUTURE EHLN 10-0 CS-B-6CS-B-6 (SUTURE) IMPLANT
SYR 3ML LL SCALE MARK (SYRINGE) ×1 IMPLANT
SYR 5ML LL (SYRINGE) ×1 IMPLANT

## 2024-01-06 NOTE — Anesthesia Preprocedure Evaluation (Signed)
Anesthesia Evaluation  Patient identified by MRN, date of birth, ID band Patient awake  General Assessment Comment: Tremor at rest in both arms. No health changes since last anesthetic two weeks ago.  Reviewed: Allergy & Precautions, H&P , NPO status , reviewed documented beta blocker date and time   Airway Mallampati: II  TM Distance: >3 FB Neck ROM: full    Dental  (+) Upper Dentures, Lower Dentures   Pulmonary former smoker   Pulmonary exam normal breath sounds clear to auscultation       Cardiovascular hypertension, + CAD, + CABG and +CHF  Normal cardiovascular exam+ Valvular Problems/Murmurs  Rhythm:Regular Rate:Normal - Systolic murmurs 16-10-96 heart cath   Prox RCA lesion is 100% stenosed.   Ost Cx lesion is 100% stenosed.   Dist LM to Prox LAD lesion is 50% stenosed.   Mid LAD-1 lesion is 100% stenosed.   Mid RCA to Dist RCA lesion is 100% stenosed.   Mid LAD-2 lesion is 100% stenosed.   1st Diag lesion is 100% stenosed.   The left ventricular systolic function is normal.   LV end diastolic pressure is normal.   The left ventricular ejection fraction is 55-65% by visual estimate.   There is no mitral valve regurgitation.   on 10/11/2023.   Recommend Aspirin 81mg  daily for moderate CAD.   Outpatient diagnostic cardiac cath right groin approach angina multivessel coronary disease coronary bypass surgery positive imaging study   Left ventriculogram showed normal left ventricular function EF of 55%   Coronaries Left main diffuse disease 50% LAD diffuse disease ostially 75% 100% mid CTO Circumflex 100% ostial CTO RCA 100% proximal CTO   Grafts SVG to mid LAD widely patent SVG to diagonal 1 widely patent SVG to circumflex widely patent SVG to the distal RCA widely patent    Neuro/Psych  Neuromuscular disease    GI/Hepatic   Endo/Other  Hypothyroidism    Renal/GU      Musculoskeletal  (+)  Arthritis ,    Abdominal  (+) + obese  Peds  Hematology   Anesthesia Other Findings Echo on 6/24:  MILD LV SYSTOLIC DYSFUNCTION (See above)  NORMAL RIGHT VENTRICULAR SYSTOLIC FUNCTION  MILD VALVULAR REGURGITATION (See above)  NO VALVULAR STENOSIS  MILD TR, PR  EF 40-45%    Parkinsons Mitral valve prolapse HTN (hypertension) CAD (coronary artery disease) Arthritis Atherosclerosis Dizziness Hx CABG   Reproductive/Obstetrics                             Anesthesia Physical Anesthesia Plan  ASA: 3  Anesthesia Plan: MAC   Post-op Pain Management: Minimal or no pain anticipated   Induction: Intravenous  PONV Risk Score and Plan: 1 and Treatment may vary due to age or medical condition, TIVA and Midazolam  Airway Management Planned: Nasal Cannula  Additional Equipment: None  Intra-op Plan:   Post-operative Plan:   Informed Consent: I have reviewed the patients History and Physical, chart, labs and discussed the procedure including the risks, benefits and alternatives for the proposed anesthesia with the patient or authorized representative who has indicated his/her understanding and acceptance.     Dental Advisory Given  Plan Discussed with: CRNA  Anesthesia Plan Comments: (Discussed risks of anesthesia with patient, including possibility of difficulty with spontaneous ventilation under anesthesia necessitating airway intervention, PONV, and rare risks such as cardiac or respiratory or neurological events, and allergic reactions. Discussed the role of CRNA in patient's  perioperative care. Patient understands.)        Anesthesia Quick Evaluation

## 2024-01-06 NOTE — Anesthesia Postprocedure Evaluation (Signed)
Anesthesia Post Note  Patient: MERRITT MCCRAVY  Procedure(s) Performed: CATARACT EXTRACTION PHACO AND INTRAOCULAR LENS PLACEMENT (IOC) RIGHT (Right: Eye)  Patient location during evaluation: PACU Anesthesia Type: MAC Level of consciousness: awake and alert Pain management: pain level controlled Vital Signs Assessment: post-procedure vital signs reviewed and stable Respiratory status: spontaneous breathing, nonlabored ventilation, respiratory function stable and patient connected to nasal cannula oxygen Cardiovascular status: stable and blood pressure returned to baseline Postop Assessment: no apparent nausea or vomiting Anesthetic complications: no   No notable events documented.   Last Vitals:  Vitals:   01/06/24 1024 01/06/24 1029  BP: 107/81 118/81  Pulse: 66 (!) 59  Resp: 17 13  Temp: 36.5 C 36.5 C  SpO2: 96% 96%    Last Pain:  Vitals:   01/06/24 1029  TempSrc:   PainSc: 0-No pain                 Corinda Gubler

## 2024-01-06 NOTE — H&P (Signed)
[facility]   Primary Care Physician:  Corky Downs, MD Ophthalmologist: Dr. Willey Blade  Pre-Procedure History & Physical: HPI:  Thomas Hood is a 68 y.o. male here for cataract surgery.   Past Medical History:  Diagnosis Date   Arthritis    Atherosclerosis    CAD (coronary artery disease)    Dizziness    HTN (hypertension)    Mitral valve prolapse    Pulmonary hypertension (HCC)    S/P CABG x 4    Wears dentures    full upper and lower   Wears hearing aid in both ears     Past Surgical History:  Procedure Laterality Date   CATARACT EXTRACTION W/PHACO Left 12/23/2023   Procedure: CATARACT EXTRACTION PHACO AND INTRAOCULAR LENS PLACEMENT (IOC) LEFT;  Surgeon: Nevada Crane, MD;  Location: [facility];  Service: Ophthalmology;  Laterality: Left;  2.82 0:27.7   CHOLESTEATOMA EXCISION Right    COLONOSCOPY     3-4 yrs. ago  Hx polyps   COLONOSCOPY WITH PROPOFOL N/A 05/01/2018   Procedure: COLONOSCOPY WITH PROPOFOL;  Surgeon: Pasty Spillers, MD;  Location: [facility];  Service: Endoscopy;  Laterality: N/A;   CORONARY ARTERY BYPASS GRAFT     ESOPHAGOGASTRODUODENOSCOPY (EGD) WITH PROPOFOL N/A 05/01/2018   Procedure: ESOPHAGOGASTRODUODENOSCOPY (EGD) WITH PROPOFOL;  Surgeon: Pasty Spillers, MD;  Location: [facility] END[facility];  Service: Endoscopy;  Laterality: N/A;   RIGHT/LEFT HEART CATH AND CORONARY ANGIOGRAPHY Bilateral 10/11/2023   Procedure: RIGHT/LEFT HEART CATH AND CORONARY ANGIOGRAPHY;  Surgeon: Alwyn Pea, MD;  Location: [facility] CV [facility];  Service: Cardiovascular;  Laterality: Bilateral;   TYMPANOPLASTY W/ MASTOIDECTOMY  08/23/2015    Prior to Admission medications   Medication Sig Start Date End Date Taking? Authorizing Provider  aspirin EC 81 MG tablet Take 81 mg by mouth daily. Swallow whole.   Yes [provider]  atorvastatin (LIPITOR) 40 MG tablet Take 1 tablet (40 mg total) by mouth daily. for cholesterol. Patient  taking differently: Take 40 mg by mouth every evening. for cholesterol. 08/01/22  Yes Masoud, Renda Rolls, MD  cetirizine (ZYRTEC) 10 MG tablet Take 10 mg by mouth at bedtime.   Yes [provider]  fluticasone (FLONASE) 50 MCG/ACT nasal spray Place into both nostrils daily.   Yes [provider]  gabapentin (NEURONTIN) 300 MG capsule Take 300 mg by mouth daily as needed (restless leg syndrome).   Yes [provider]  levothyroxine (SYNTHROID) 50 MCG tablet Take 1 tablet (50 mcg total) by mouth daily. Patient taking differently: Take 50 mcg by mouth at bedtime. 08/01/22  Yes Masoud, Renda Rolls, MD  lisinopril (ZESTRIL) 5 MG tablet Take 5 mg by mouth every evening. 07/22/23 07/21/24 Yes [provider]  metoprolol tartrate (LOPRESSOR) 25 MG tablet TAKE (1) TABLET BY MOUTH EVERY TWELVE HOURS. Patient taking differently: Take 25 mg by mouth at bedtime. 08/01/22  Yes Masoud, Renda Rolls, MD  rOPINIRole (REQUIP) 0.25 MG tablet Take 0.25 mg by mouth 3 (three) times daily. 07/26/23  Yes [provider]  torsemide (DEMADEX) 20 MG tablet Take 20 mg by mouth every 3 (three) days. 09/03/23 09/02/24 Yes [provider]  sildenafil (VIAGRA) 100 MG tablet Take 100 mg by mouth daily as needed for erectile dysfunction.    [provider]    Allergies as of 10/21/2023   (No Known Allergies)    Family History  Problem Relation Age of Onset   Cancer Mother  colon   Cancer Sister        lung cancer-hx smoking   Heart failure Sister     Social History   Socioeconomic History   Marital status: Single    Spouse name: Not on file   Number of children: Not on file   Years of education: Not on file   Highest education level: Not on file  Occupational History   Not on file  Tobacco Use   Smoking status: Former    Current packs/day: 0.00    Average packs/day: 1 pack/day for 20.0 years (20.0 ttl pk-yrs)    Types: Cigarettes    Start date: 66    Quit date:  36    Years since quitting: 35.0   Smokeless tobacco: Never  Vaping Use   Vaping status: Never Used  Substance and Sexual Activity   Alcohol use: Not Currently    Comment: occ.   Drug use: Never   Sexual activity: Not on file  Other Topics Concern   Not on file  Social History Narrative   Not on file   Social Drivers of Health   Financial Resource Strain: Low Risk  (08/01/2023)   Received from [facility]   Overall Financial Resource Strain (CARDIA)    Difficulty of Paying Living Expenses: Not hard at all  Food Insecurity: No Food Insecurity (08/01/2023)   Received from [facility]   Hunger Vital Sign    Worried About Running Out of Food in the Last Year: Never true    Ran Out of Food in the Last Year: Never true  Transportation Needs: No Transportation Needs (08/01/2023)   Received from [facility] - Transportation    In the past 12 months, has lack of transportation kept you from medical appointments or from getting medications?: No    Lack of Transportation (Non-Medical): No  Physical Activity: Sufficiently Active (11/10/2021)   Exercise Vital Sign    Days of Exercise per Week: 4 days    Minutes of Exercise per Session: 40 min  Stress: No Stress Concern Present (11/10/2021)   [employer] of Occupational Health - Occupational Stress Questionnaire    Feeling of Stress : Not at all  Social Connections: Moderately Integrated (11/10/2021)   Social Connection and Isolation Panel [NHANES]    Frequency of Communication with Friends and Family: More than three times a week    Frequency of Social Gatherings with Friends and Family: More than three times a week    Attends Religious Services: Never    Database administrator or Organizations: No    Attends Engineer, structural: More than 4 times per year    Marital Status: Married  Catering manager Violence: Not At Risk (11/10/2021)   Humiliation, Afraid,  Rape, and Kick questionnaire    Fear of Current or Ex-Partner: No    Emotionally Abused: No    Physically Abused: No    Sexually Abused: No    Review of Systems: See HPI, otherwise negative ROS  Physical Exam: BP (!) 129/93   Pulse 96   Temp 97.6 F (36.4 C) (Temporal)   Wt 103.9 kg   SpO2 96%   BMI 38.11 kg/m  General:   Alert, cooperative in NAD Head:  Normocephalic and atraumatic. Respiratory:  Normal work of breathing. Cardiovascular:  RRR  Impression/Plan: Thomas Hood is here for cataract surgery.  Risks, benefits, limitations, and alternatives regarding cataract surgery have been  reviewed with the patient.  Questions have been answered.  All parties agreeable.   Willey Blade, MD  01/06/2024, 10:00 AM

## 2024-01-06 NOTE — Transfer of Care (Signed)
Immediate Anesthesia Transfer of Care Note  Patient: Thomas Hood  Procedure(s) Performed: CATARACT EXTRACTION PHACO AND INTRAOCULAR LENS PLACEMENT (IOC) RIGHT (Right: Eye)  Patient Location: PACU  Anesthesia Type: MAC  Level of Consciousness: awake, alert  and patient cooperative  Airway and Oxygen Therapy: Patient Spontanous Breathing and Patient connected to supplemental oxygen  Post-op Assessment: Post-op Vital signs reviewed, Patient's Cardiovascular Status Stable, Respiratory Function Stable, Patent Airway and No signs of Nausea or vomiting  Post-op Vital Signs: Reviewed and stable  Complications: No notable events documented.

## 2024-01-06 NOTE — Op Note (Signed)
OPERATIVE NOTE  Thomas Hood [id number] 01/06/2024   PREOPERATIVE DIAGNOSIS:  Nuclear sclerotic cataract right eye.  H25.11   POSTOPERATIVE DIAGNOSIS:    Nuclear sclerotic cataract right eye.     PROCEDURE:  Phacoemusification with posterior chamber intraocular lens placement of the right eye   LENS:  * No implants in log *     Procedure(s) with comments: CATARACT EXTRACTION PHACO AND INTRAOCULAR LENS PLACEMENT (IOC) RIGHT (Right) - 1.69 0:18.8  SURGEON:  Willey Blade, MD, MPH  ANESTHESIOLOGIST: Anesthesiologist: Corinda Gubler, MD CRNA: Barbette Hair, CRNA   ANESTHESIA:  Topical with tetracaine drops augmented with 1% preservative-free intracameral lidocaine.  ESTIMATED BLOOD LOSS: less than 1 mL.   COMPLICATIONS:  None.   DESCRIPTION OF PROCEDURE:  The patient was identified in the holding room and transported to the operating room and placed in the supine position under the operating microscope.  The right eye was identified as the operative eye and it was prepped and draped in the usual sterile ophthalmic fashion.   A 1.0 millimeter clear-corneal paracentesis was made at the 10:30 position. 0.5 ml of preservative-free 1% lidocaine with epinephrine was injected into the anterior chamber.  The anterior chamber was filled with viscoelastic.  A 2.4 millimeter keratome was used to make a near-clear corneal incision at the 8:00 position.  A curvilinear capsulorrhexis was made with a cystotome and capsulorrhexis forceps.  Balanced salt solution was used to hydrodissect and hydrodelineate the nucleus.   Phacoemulsification was then used in stop and chop fashion to remove the lens nucleus and epinucleus.  The remaining cortex was then removed using the irrigation and aspiration handpiece. Viscoelastic was then placed into the capsular bag to distend it for lens placement.  A lens was then injected into the capsular bag.  The remaining viscoelastic was aspirated.   Wounds were hydrated  with balanced salt solution.  The anterior chamber was inflated to a physiologic pressure with balanced salt solution.   Intracameral vigamox 0.1 mL undiluted was injected into the eye and a drop placed onto the ocular surface.  No wound leaks were noted.  The patient was taken to the recovery room in stable condition without complications of anesthesia or surgery  Willey Blade 01/06/2024, 10:23 AM

## 2024-01-07 ENCOUNTER — Encounter: Payer: Self-pay | Admitting: Ophthalmology

## 2024-01-08 ENCOUNTER — Encounter: Payer: Self-pay | Admitting: Ophthalmology

## 2024-03-15 NOTE — Progress Notes (Deleted)
 03/17/2024 9:53 PM   Thomas Hood [birth year] [id number]  Referring provider: Corky Downs, MD [address] [place],  [place] [address]  Urological history: 1. Erectile dysfunction -contributing factors of age, BPH, HTN, CAD, COVID-19, hypothyroidisms, metabolic syndrome, HLD, Parkinson's, sleep disturbances, alcohol consumption and history of smoking -failed PDE5i's  -failed ICI  2. BPH with LU TS  No chief complaint on file.  HPI: Thomas Hood is a 68 y.o. male who presents today for follow up.  Previous records reviewed.   I PSS ***    Score:  1-7 Mild 8-19 Moderate 20-35 Severe   SHIM ***    Score: 1-7 Severe ED 8-11 Moderate ED 12-16 Mild-Moderate ED 17-21 Mild ED 22-25 No ED   PMH: Past Medical History:  Diagnosis Date   Arthritis    Atherosclerosis    CAD (coronary artery disease)    Dizziness    HTN (hypertension)    Mitral valve prolapse    Pulmonary hypertension (HCC)    S/P CABG x 4    Wears dentures    full upper and lower   Wears hearing aid in both ears     Surgical History: Past Surgical History:  Procedure Laterality Date   CATARACT EXTRACTION W/PHACO Left 12/23/2023   Procedure: CATARACT EXTRACTION PHACO AND INTRAOCULAR LENS PLACEMENT (IOC) LEFT;  Surgeon: Nevada Crane, MD;  Location: [facility];  Service: Ophthalmology;  Laterality: Left;  2.82 0:27.7   CATARACT EXTRACTION W/PHACO Right 01/06/2024   Procedure: CATARACT EXTRACTION PHACO AND INTRAOCULAR LENS PLACEMENT (IOC) RIGHT;  Surgeon: Nevada Crane, MD;  Location: [facility];  Service: Ophthalmology;  Laterality: Right;  1.69 0:18.8   CHOLESTEATOMA EXCISION Right    COLONOSCOPY     3-4 yrs. ago  Hx polyps   COLONOSCOPY WITH PROPOFOL N/A 05/01/2018   Procedure: COLONOSCOPY WITH PROPOFOL;  Surgeon: Pasty Spillers, MD;  Location: [facility];  Service: Endoscopy;  Laterality: N/A;   CORONARY ARTERY BYPASS GRAFT      ESOPHAGOGASTRODUODENOSCOPY (EGD) WITH PROPOFOL N/A 05/01/2018   Procedure: ESOPHAGOGASTRODUODENOSCOPY (EGD) WITH PROPOFOL;  Surgeon: Pasty Spillers, MD;  Location: [facility];  Service: Endoscopy;  Laterality: N/A;   RIGHT/LEFT HEART CATH AND CORONARY ANGIOGRAPHY Bilateral 10/11/2023   Procedure: RIGHT/LEFT HEART CATH AND CORONARY ANGIOGRAPHY;  Surgeon: Alwyn Pea, MD;  Location: [facility];  Service: Cardiovascular;  Laterality: Bilateral;   TYMPANOPLASTY W/ MASTOIDECTOMY  08/23/2015    Home Medications:  Allergies as of 03/17/2024   No Known Allergies      Medication List        Accurate as of March 15, 2024  9:53 PM. If you have any questions, ask your nurse or doctor.          aspirin EC 81 MG tablet Take 81 mg by mouth daily. Swallow whole.   atorvastatin 40 MG tablet Commonly known as: LIPITOR Take 1 tablet (40 mg total) by mouth daily. for cholesterol. What changed: when to take this   cetirizine 10 MG tablet Commonly known as: ZYRTEC Take 10 mg by mouth at bedtime.   fluticasone 50 MCG/ACT nasal spray Commonly known as: FLONASE Place into both nostrils daily.   gabapentin 300 MG capsule Commonly known as: NEURONTIN Take 300 mg by mouth daily as needed (restless leg syndrome).   levothyroxine 50 MCG tablet Commonly known as: SYNTHROID Take 1 tablet (50 mcg total) by mouth daily. What changed: when to take this  lisinopril 5 MG tablet Commonly known as: ZESTRIL Take 5 mg by mouth every evening.   metoprolol tartrate 25 MG tablet Commonly known as: LOPRESSOR TAKE (1) TABLET BY MOUTH EVERY TWELVE HOURS. What changed:  how much to take how to take this when to take this additional instructions   rOPINIRole 0.25 MG tablet Commonly known as: REQUIP Take 0.25 mg by mouth 3 (three) times daily.   sildenafil 100 MG tablet Commonly known as: VIAGRA Take 100 mg by mouth daily as needed for erectile dysfunction.   torsemide 20 MG  tablet Commonly known as: DEMADEX Take 20 mg by mouth every 3 (three) days.        Allergies: No Known Allergies  Family History: Family History  Problem Relation Age of Onset   Cancer Mother        colon   Cancer Sister        lung cancer-hx smoking   Heart failure Sister     Social History:  reports that he quit smoking about 35 years ago. His smoking use included cigarettes. He started smoking about 55 years ago. He has a 20 pack-year smoking history. He has never used smokeless tobacco. He reports that he does not currently use alcohol. He reports that he does not use drugs.  ROS: Pertinent ROS in HPI  Physical Exam: There were no vitals taken for this visit.  Constitutional:  Well nourished. Alert and oriented, No acute distress. HEENT: [place] AT, moist mucus membranes.  Trachea midline, no masses. Cardiovascular: No clubbing, cyanosis, or edema. Respiratory: Normal respiratory effort, no increased work of breathing. GI: Abdomen is soft, non tender, non distended, no abdominal masses. Liver and spleen not palpable.  No hernias appreciated.  Stool sample for occult testing is not indicated.   GU: No CVA tenderness.  No bladder fullness or masses.  Patient with circumcised/uncircumcised phallus. ***Foreskin easily retracted***  Urethral meatus is patent.  No penile discharge. No penile lesions or rashes. Scrotum without lesions, cysts, rashes and/or edema.  Testicles are located scrotally bilaterally. No masses are appreciated in the testicles. Left and right epididymis are normal. Rectal: Patient with  normal sphincter tone. Anus and perineum without scarring or rashes. No rectal masses are appreciated. Prostate is approximately *** grams, *** nodules are appreciated. Seminal vesicles are normal. Skin: No rashes, bruises or suspicious lesions. Lymph: No cervical or inguinal adenopathy. Neurologic: Grossly intact, no focal deficits, moving all 4 extremities. Psychiatric: Normal  mood and affect.  Laboratory Data: Component     Latest Ref Rng 10/11/2023  Hemoglobin     13.0 - 17.0 g/dL 57.8   Hemoglobin      15.0   HCT     39.0 - 52.0 % 43.0   HCT      44.0   Sodium     135 - 145 mmol/L 139   Sodium      138   Potassium     3.5 - 5.1 mmol/L 4.2   Potassium      4.2   Bicarbonate     20.0 - 28.0 mmol/L 25.2   Bicarbonate      26.2   TCO2     22 - 32 mmol/L 26   TCO2      28   O2 Saturation     % 94   O2 Saturation      65   Acid-Base Excess     0.0 - 2.0 mmol/L 0.0   Acid-Base Excess  1.0   Calcium Ionized     1.15 - 1.40 mmol/L 1.22   Calcium Ionized      1.24   Sample type ARTERIAL   Sample type VENOUS   pH, Arterial     7.35 - 7.45  7.397   pCO2 arterial     32 - 48 mmHg 41.0   pO2, Arterial     83 - 108 mmHg 72 (L)     Legend: (L) Low  CBC w/auto Differential (5 Part) Order: [id number] Component Ref Range & Units 5 mo ago  WBC (White Blood Cell Count) 4.1 - 10.2 10^3/uL 5.3  RBC (Red Blood Cell Count) 4.69 - 6.13 10^6/uL 5.1  Hemoglobin 14.1 - 18.1 gm/dL 47.8  Hematocrit 29.5 - 52.0 % 46.9  MCV (Mean Corpuscular Volume) 80.0 - 100.0 fl 92  MCH (Mean Corpuscular Hemoglobin) 27.0 - 31.2 pg 30.2  MCHC (Mean Corpuscular Hemoglobin Concentration) 32.0 - 36.0 gm/dL 62.1  Platelet Count 308 - 450 10^3/uL 149 Low   RDW-CV (Red Cell Distribution Width) 11.6 - 14.8 % 13.1  MPV (Mean Platelet Volume) 9.4 - 12.4 fl 9.9  Neutrophils 1.50 - 7.80 10^3/uL 2.38  Lymphocytes 1.00 - 3.60 10^3/uL 2.23  Monocytes 0.00 - 1.50 10^3/uL 0.53  Eosinophils 0.00 - 0.55 10^3/uL 0.08  Basophils 0.00 - 0.09 10^3/uL 0.04  Neutrophil % 32.0 - 70.0 % 45.1  Lymphocyte % 10.0 - 50.0 % 42.2  Monocyte % 4.0 - 13.0 % 10  Eosinophil % 1.0 - 5.0 % 1.5  Basophil% 0.0 - 2.0 % 0.8  Immature Granulocyte % <=0.7 % 0.4  Immature Granulocyte Count <=0.06 10^3/L 0.02  Resulting Agency [facility]   Specimen Collected:  09/24/23 15:27   Performed by: [facility] Last Resulted: 09/24/23 15:52  Received From: [facility]  Result Received: 10/01/23 11:56    Urinalysis See EPIC and HPI *** I have reviewed the labs.   Pertinent Imaging: N/A  Assessment & Plan:  ***  1. Erectile dysfunction ***  2. BPH with LU TS ***   No follow-ups on file.  These notes generated with voice recognition software. I apologize for typographical errors.  Cloretta Ned  [facility] [address]  [address] [place], [place] [address] [phone]

## 2024-03-17 ENCOUNTER — Ambulatory Visit: Payer: Medicare HMO | Admitting: Urology

## 2024-03-17 ENCOUNTER — Encounter: Payer: Self-pay | Admitting: Urology

## 2024-03-17 DIAGNOSIS — N5201 Erectile dysfunction due to arterial insufficiency: Secondary | ICD-10-CM

## 2024-03-17 DIAGNOSIS — N138 Other obstructive and reflux uropathy: Secondary | ICD-10-CM

## 2024-04-23 ENCOUNTER — Ambulatory Visit (INDEPENDENT_AMBULATORY_CARE_PROVIDER_SITE_OTHER): Payer: Self-pay | Admitting: Nurse Practitioner

## 2024-04-23 ENCOUNTER — Encounter: Payer: Self-pay | Admitting: Nurse Practitioner

## 2024-04-23 VITALS — BP 118/81 | HR 83 | Temp 98.5°F | Resp 16 | Ht 65.0 in | Wt 231.6 lb

## 2024-04-23 DIAGNOSIS — E782 Mixed hyperlipidemia: Secondary | ICD-10-CM

## 2024-04-23 DIAGNOSIS — J301 Allergic rhinitis due to pollen: Secondary | ICD-10-CM

## 2024-04-23 DIAGNOSIS — M25562 Pain in left knee: Secondary | ICD-10-CM

## 2024-04-23 DIAGNOSIS — E063 Autoimmune thyroiditis: Secondary | ICD-10-CM | POA: Diagnosis not present

## 2024-04-23 DIAGNOSIS — I2510 Atherosclerotic heart disease of native coronary artery without angina pectoris: Secondary | ICD-10-CM | POA: Diagnosis not present

## 2024-04-23 DIAGNOSIS — N5201 Erectile dysfunction due to arterial insufficiency: Secondary | ICD-10-CM

## 2024-04-23 DIAGNOSIS — I10 Essential (primary) hypertension: Secondary | ICD-10-CM | POA: Diagnosis not present

## 2024-04-23 DIAGNOSIS — G2581 Restless legs syndrome: Secondary | ICD-10-CM

## 2024-04-23 MED ORDER — METOPROLOL TARTRATE 25 MG PO TABS: 25.0000 mg | ORAL_TABLET | Freq: Two times a day (BID) | ORAL | 6 refills | Status: AC

## 2024-04-23 MED ORDER — ATORVASTATIN CALCIUM 40 MG PO TABS: 40.0000 mg | ORAL_TABLET | Freq: Every day | ORAL | 6 refills | Status: AC

## 2024-04-23 MED ORDER — TRAMADOL HCL 50 MG PO TABS: 50.0000 mg | ORAL_TABLET | Freq: Four times a day (QID) | ORAL | 0 refills | Status: AC | PRN

## 2024-04-23 MED ORDER — LEVOTHYROXINE SODIUM 50 MCG PO TABS
50.0000 ug | ORAL_TABLET | Freq: Every day | ORAL | 6 refills | Status: AC
Start: 2024-04-23 — End: ?

## 2024-04-23 NOTE — Progress Notes (Signed)
 [facility] [address] [place], [place] [address]  Internal MEDICINE  Office Visit Note  Patient Name: Thomas Hood  [id number]  [id number]  Date of Service: 04/23/2024   Complaints/HPI Pt is here for establishment of PCP. Chief Complaint  Patient presents with   New Patient (Initial Visit)    Est care.     HPI Nitish presents for a new patient visit to establish care.  Well-appearing 68 y.o. male with hypertension, high cholesterol, CAD, erectile dysfunction, hypothyroidism d/t hashimoto's, restless legs, and allergic rhinitis.  Work: retired, used to work in the [employer]: live at home with girlfriend/fiance, been together 30 years.  Diet: fair Exercise: work around yard and house  Tobacco use: none  Alcohol use: rarely  Illicit drug use: none  Routine CRC screening: due in 2029, done last in 2019  Labs:  need thyroid  labs  New or worsening pain: new pain in the left knee, reports that he twisted it a couple of days, reports 7/10 on pain scale.    Current Medication: Outpatient Encounter Medications as of 04/23/2024  Medication Sig   aspirin  EC 81 MG tablet Take 81 mg by mouth daily. Swallow whole.   cetirizine (ZYRTEC) 10 MG tablet Take 10 mg by mouth at bedtime.   fluticasone (FLONASE) 50 MCG/ACT nasal spray Place into both nostrils daily.   gabapentin (NEURONTIN) 300 MG capsule Take 300 mg by mouth daily as needed (restless leg syndrome).   lisinopril (ZESTRIL) 5 MG tablet Take 5 mg by mouth every evening.   rOPINIRole (REQUIP) 0.25 MG tablet Take 0.25 mg by mouth 3 (three) times daily.   sildenafil  (VIAGRA ) 100 MG tablet Take 100 mg by mouth daily as needed for erectile dysfunction.   torsemide (DEMADEX) 20 MG tablet Take 20 mg by mouth every 3 (three) days.   traMADol  (ULTRAM ) 50 MG tablet Take 1 tablet (50 mg total) by mouth every 6 (six) hours as needed for up to 5 days for moderate pain (pain score 4-6) or severe pain (pain score 7-10).    [DISCONTINUED] atorvastatin  (LIPITOR) 40 MG tablet Take 1 tablet (40 mg total) by mouth daily. for cholesterol. (Patient taking differently: Take 40 mg by mouth every evening. for cholesterol.)   [DISCONTINUED] levothyroxine  (SYNTHROID ) 50 MCG tablet Take 1 tablet (50 mcg total) by mouth daily. (Patient taking differently: Take 50 mcg by mouth at bedtime.)   [DISCONTINUED] metoprolol  tartrate (LOPRESSOR ) 25 MG tablet TAKE (1) TABLET BY MOUTH EVERY TWELVE HOURS. (Patient taking differently: Take 25 mg by mouth at bedtime.)   atorvastatin  (LIPITOR) 40 MG tablet Take 1 tablet (40 mg total) by mouth daily. for cholesterol.   levothyroxine  (SYNTHROID ) 50 MCG tablet Take 1 tablet (50 mcg total) by mouth daily. In the morning on an empty stomach   metoprolol  tartrate (LOPRESSOR ) 25 MG tablet Take 1 tablet (25 mg total) by mouth 2 (two) times daily. Take with a meal   No facility-administered encounter medications on file as of 04/23/2024.    Surgical History: Past Surgical History:  Procedure Laterality Date   CATARACT EXTRACTION W/PHACO Left 12/23/2023   Procedure: CATARACT EXTRACTION PHACO AND INTRAOCULAR LENS PLACEMENT (IOC) LEFT;  Surgeon: Rosa College, MD;  Location: [facility];  Service: Ophthalmology;  Laterality: Left;  2.82 0:27.7   CATARACT EXTRACTION W/PHACO Right 01/06/2024   Procedure: CATARACT EXTRACTION PHACO AND INTRAOCULAR LENS PLACEMENT (IOC) RIGHT;  Surgeon: Rosa College, MD;  Location: [facility];  Service:  Ophthalmology;  Laterality: Right;  1.69 0:18.8   CHOLESTEATOMA EXCISION Right    COLONOSCOPY     3-4 yrs. ago  Hx polyps   COLONOSCOPY WITH PROPOFOL  N/A 05/01/2018   Procedure: COLONOSCOPY WITH PROPOFOL ;  Surgeon: Irby Mannan, MD;  Location: [facility];  Service: Endoscopy;  Laterality: N/A;   CORONARY ARTERY BYPASS GRAFT     ESOPHAGOGASTRODUODENOSCOPY (EGD) WITH PROPOFOL  N/A 05/01/2018   Procedure: ESOPHAGOGASTRODUODENOSCOPY (EGD) WITH  PROPOFOL ;  Surgeon: Irby Mannan, MD;  Location: [facility];  Service: Endoscopy;  Laterality: N/A;   RIGHT/LEFT HEART CATH AND CORONARY ANGIOGRAPHY Bilateral 10/11/2023   Procedure: RIGHT/LEFT HEART CATH AND CORONARY ANGIOGRAPHY;  Surgeon: Antonette Batters, MD;  Location: [facility];  Service: Cardiovascular;  Laterality: Bilateral;   TYMPANOPLASTY W/ MASTOIDECTOMY  08/23/2015    Medical History: Past Medical History:  Diagnosis Date   Arthritis    Atherosclerosis    CAD (coronary artery disease)    Dizziness    HTN (hypertension)    Mitral valve prolapse    Pulmonary hypertension (HCC)    S/P CABG x 4    Wears dentures    full upper and lower   Wears hearing aid in both ears     Family History: Family History  Problem Relation Age of Onset   Cancer Mother        colon   Cancer Sister        lung cancer-hx smoking   Heart failure Sister     Social History   Socioeconomic History   Marital status: Single    Spouse name: Not on file   Number of children: Not on file   Years of education: Not on file   Highest education level: Not on file  Occupational History   Not on file  Tobacco Use   Smoking status: Former    Current packs/day: 0.00    Average packs/day: 1 pack/day for 20.0 years (20.0 ttl pk-yrs)    Types: Cigarettes    Start date: 86    Quit date: 55    Years since quitting: 35.3   Smokeless tobacco: Never  Vaping Use   Vaping status: Never Used  Substance and Sexual Activity   Alcohol use: Not Currently    Comment: occ.   Drug use: Never   Sexual activity: Not on file  Other Topics Concern   Not on file  Social History Narrative   Not on file   Social Drivers of Health   Financial Resource Strain: Low Risk  (08/01/2023)   Received from [facility]   Overall Financial Resource Strain (CARDIA)    Difficulty of Paying Living Expenses: Not hard at all  Food Insecurity: No Food Insecurity (08/01/2023)    Received from [facility]   Hunger Vital Sign    Worried About Running Out of Food in the Last Year: Never true    Ran Out of Food in the Last Year: Never true  Transportation Needs: No Transportation Needs (08/01/2023)   Received [facility] - [facility]    In the past 12 months, has lack of transportation kept you from medical appointments or from getting medications?: No    Lack of Transportation (Non-Medical): No  Physical Activity: Sufficiently Active (11/10/2021)   Exercise Vital Sign    Days of Exercise per Week: 4 days    Minutes of Exercise per Session: 40 min  Stress: No Stress Concern  Present (11/10/2021)   [employer] of Occupational Health - Occupational Stress Questionnaire    Feeling of Stress : Not at all  Social Connections: Moderately Integrated (11/10/2021)   Social Connection and Isolation Panel [NHANES]    Frequency of Communication with Friends and Family: More than three times a week    Frequency of Social Gatherings with Friends and Family: More than three times a week    Attends Religious Services: Never    Database administrator or Organizations: No    Attends Engineer, structural: More than 4 times per year    Marital Status: Married  Catering manager Violence: Not At Risk (11/10/2021)   Humiliation, Afraid, Rape, and Kick questionnaire    Fear of Current or Ex-Partner: No    Emotionally Abused: No    Physically Abused: No    Sexually Abused: No     Review of Systems  Constitutional:  Negative for chills, fatigue and unexpected weight change.  HENT:  Positive for congestion, postnasal drip, rhinorrhea and sneezing. Negative for sore throat.   Eyes:  Negative for redness.  Respiratory:  Negative for cough, chest tightness and shortness of breath.   Cardiovascular: Negative.  Negative for chest pain and palpitations.  Gastrointestinal: Negative.  Negative for abdominal pain, constipation,  diarrhea, nausea and vomiting.  Genitourinary:  Negative for dysuria and frequency.  Musculoskeletal:  Positive for arthralgias (left knee pain) and joint swelling. Negative for back pain and neck pain.  Skin:  Negative for rash.  Neurological: Negative.  Negative for tremors and numbness.  Hematological:  Negative for adenopathy. Does not bruise/bleed easily.  Psychiatric/Behavioral:  Negative for behavioral problems (Depression), self-injury, sleep disturbance and suicidal ideas. The patient is not nervous/anxious.     Vital Signs: BP 118/81   Pulse 83   Temp 98.5 F (36.9 C)   Resp 16   Ht 5' 5 (1.651 m)   Wt 231 lb 9.6 oz (105.1 kg)   SpO2 96%   BMI 38.54 kg/m    Physical Exam Vitals reviewed.  Constitutional:      General: He is not in acute distress.    Appearance: Normal appearance. He is obese. He is not ill-appearing.  HENT:     Head: Normocephalic and atraumatic.   Eyes:     Pupils: Pupils are equal, round, and reactive to light.    Cardiovascular:     Rate and Rhythm: Normal rate and regular rhythm.  Pulmonary:     Effort: Pulmonary effort is normal. No respiratory distress.   Musculoskeletal:     Left knee: Swelling present. Decreased range of motion. Tenderness present.   Neurological:     Mental Status: He is alert and oriented to person, place, and time.   Psychiatric:        Mood and Affect: Mood normal.        Behavior: Behavior normal.       Assessment/Plan: 1. Essential hypertension Stable, continue metoprolol , lisinopril and torsemide as prescribed.  - metoprolol  tartrate (LOPRESSOR ) 25 MG tablet; Take 1 tablet (25 mg total) by mouth 2 (two) times daily. Take with a meal  Dispense: 60 tablet; Refill: 6  2. Mixed hyperlipidemia Continue atorvastatin  as prescribed.  - atorvastatin  (LIPITOR) 40 MG tablet; Take 1 tablet (40 mg total) by mouth daily. for cholesterol.  Dispense: 30 tablet; Refill: 6  3. Coronary artery disease,  non-occlusive (Primary) Continue atorvastatin  as prescribed.   4. Hypothyroidism due to Hashimoto's thyroiditis Repeat thyroid  labs.  Continue levothyroxine  as prescribed.  - levothyroxine  (SYNTHROID ) 50 MCG tablet; Take 1 tablet (50 mcg total) by mouth daily. In the morning on an empty stomach  Dispense: 30 tablet; Refill: 6 - TSH + free T4  5. Acute pain of left knee Small amount of tramadol  prescribed. Take as needed. Follow up if no improvement or worsening of pain.  - traMADol  (ULTRAM ) 50 MG tablet; Take 1 tablet (50 mg total) by mouth every 6 (six) hours as needed for up to 5 days for moderate pain (pain score 4-6) or severe pain (pain score 7-10).  Dispense: 20 tablet; Refill: 0  6. Restless legs syndrome Continue ropinirole as prescribed.   7. Seasonal allergic rhinitis due to pollen Continue flonase and cetirizine as prescribed.  8. Erectile dysfunction due to arterial insufficiency Continue prn sildenafil  as prescribed.     General Counseling: tamer baughman understanding of the findings of todays visit and agrees with plan of treatment. I have discussed any further diagnostic evaluation that may be needed or ordered today. We also reviewed his medications today. he has been encouraged to call the office with any questions or concerns that should arise related to todays visit.    Orders Placed This Encounter  Procedures   TSH + free T4    Meds ordered this encounter  Medications   atorvastatin  (LIPITOR) 40 MG tablet    Sig: Take 1 tablet (40 mg total) by mouth daily. for cholesterol.    Dispense:  30 tablet    Refill:  6   metoprolol  tartrate (LOPRESSOR ) 25 MG tablet    Sig: Take 1 tablet (25 mg total) by mouth 2 (two) times daily. Take with a meal    Dispense:  60 tablet    Refill:  6   levothyroxine  (SYNTHROID ) 50 MCG tablet    Sig: Take 1 tablet (50 mcg total) by mouth daily. In the morning on an empty stomach    Dispense:  30 tablet    Refill:  6   traMADol   (ULTRAM ) 50 MG tablet    Sig: Take 1 tablet (50 mg total) by mouth every 6 (six) hours as needed for up to 5 days for moderate pain (pain score 4-6) or severe pain (pain score 7-10).    Dispense:  20 tablet    Refill:  0    Fill new script today    Return in about 6 months (around 10/24/2024) for F/U, Aurianna Earlywine PCP, order labs .  Time spent:30 Minutes Time spent with patient included reviewing progress notes, labs, imaging studies, and discussing plan for follow up.   [place] Controlled Substance Database was reviewed by me for overdose risk score (ORS)   This patient was seen by Laurence Pons, FNP-C in collaboration with Dr. Verneta Gone as a part of collaborative care agreement.   Lucelia Lacey R. Bobbi Burow, MSN, FNP-C Internal Medicine

## 2024-05-08 ENCOUNTER — Other Ambulatory Visit
Admission: RE | Admit: 2024-05-08 | Discharge: 2024-05-08 | Disposition: A | Discharge status: Home / Self Care | Source: Ambulatory Visit | Attending: Nurse Practitioner | Admitting: Nurse Practitioner

## 2024-05-08 DIAGNOSIS — E063 Autoimmune thyroiditis: Secondary | ICD-10-CM | POA: Diagnosis present

## 2024-05-08 LAB — T4, FREE: Free T4: 0.73 ng/dL (ref 0.61–1.12)

## 2024-05-08 LAB — TSH: TSH: 4.488 u[IU]/mL (ref 0.350–4.500)

## 2024-05-21 ENCOUNTER — Encounter: Payer: Self-pay | Admitting: Nurse Practitioner

## 2024-06-10 ENCOUNTER — Encounter: Payer: Self-pay | Admitting: Nurse Practitioner

## 2024-06-10 ENCOUNTER — Ambulatory Visit (INDEPENDENT_AMBULATORY_CARE_PROVIDER_SITE_OTHER): Admitting: Nurse Practitioner

## 2024-06-10 VITALS — BP 126/84 | HR 79 | Temp 98.3°F | Resp 16 | Ht 65.0 in | Wt 231.2 lb

## 2024-06-10 DIAGNOSIS — R350 Frequency of micturition: Secondary | ICD-10-CM | POA: Diagnosis not present

## 2024-06-10 DIAGNOSIS — N401 Enlarged prostate with lower urinary tract symptoms: Secondary | ICD-10-CM | POA: Diagnosis not present

## 2024-06-10 MED ORDER — TAMSULOSIN HCL 0.4 MG PO CAPS: 0.4000 mg | ORAL_CAPSULE | Freq: Every day | ORAL | 5 refills | Status: AC

## 2024-06-10 NOTE — Progress Notes (Signed)
 [facility] [address] [place], [place] [address]  Internal MEDICINE  Office Visit Note  Patient Name: Thomas Hood  [id number]  [id number]  Date of Service: 06/10/2024  Chief Complaint  Patient presents with   Acute Visit     HPI Trase presents for an acute sick visit for difficult urination History of enlarged prostate Reports hesitancy, frequency, weak urine stream.     Current Medication:  Outpatient Encounter Medications as of 06/10/2024  Medication Sig   tamsulosin (FLOMAX) 0.4 MG CAPS capsule Take 1 capsule (0.4 mg total) by mouth daily.   aspirin  EC 81 MG tablet Take 81 mg by mouth daily. Swallow whole.   atorvastatin  (LIPITOR) 40 MG tablet Take 1 tablet (40 mg total) by mouth daily. for cholesterol.   cetirizine (ZYRTEC) 10 MG tablet Take 10 mg by mouth at bedtime.   fluticasone (FLONASE) 50 MCG/ACT nasal spray Place into both nostrils daily.   gabapentin (NEURONTIN) 300 MG capsule Take 300 mg by mouth daily as needed (restless leg syndrome).   levothyroxine  (SYNTHROID ) 50 MCG tablet Take 1 tablet (50 mcg total) by mouth daily. In the morning on an empty stomach   lisinopril (ZESTRIL) 5 MG tablet Take 5 mg by mouth every evening.   metoprolol  tartrate (LOPRESSOR ) 25 MG tablet Take 1 tablet (25 mg total) by mouth 2 (two) times daily. Take with a meal   rOPINIRole (REQUIP) 0.25 MG tablet Take 0.25 mg by mouth 3 (three) times daily.   sildenafil  (VIAGRA ) 100 MG tablet Take 100 mg by mouth daily as needed for erectile dysfunction.   torsemide (DEMADEX) 20 MG tablet Take 20 mg by mouth every 3 (three) days.   No facility-administered encounter medications on file as of 06/10/2024.      Medical History: Past Medical History:  Diagnosis Date   Arthritis    Atherosclerosis    CAD (coronary artery disease)    Dizziness    HTN (hypertension)    Mitral valve prolapse    Pulmonary hypertension (HCC)    S/P CABG x 4    Wears dentures    full upper and  lower   Wears hearing aid in both ears      Vital Signs: BP 126/84   Pulse 79   Temp 98.3 F (36.8 C)   Resp 16   Ht 5' 5 (1.651 m)   Wt 231 lb 3.2 oz (104.9 kg)   SpO2 96%   BMI 38.47 kg/m    Review of Systems  Constitutional:  Positive for fatigue.  Respiratory: Negative.  Negative for cough, chest tightness, shortness of breath and wheezing.   Cardiovascular: Negative.  Negative for chest pain and palpitations.  Genitourinary:  Positive for difficulty urinating and frequency.       Hesitancy and weak urine stream and erectile dysfunction.   Neurological:  Positive for tremors.    Physical Exam Vitals reviewed.  Constitutional:      General: He is not in acute distress.    Appearance: Normal appearance. He is obese. He is not ill-appearing.  HENT:     Head: Normocephalic and atraumatic.  Eyes:     Pupils: Pupils are equal, round, and reactive to light.  Pulmonary:     Effort: Pulmonary effort is normal. No respiratory distress.  Neurological:     Mental Status: He is alert and oriented to person, place, and time.  Psychiatric:        Mood and Affect: Mood normal.  Behavior: Behavior normal.       Assessment/Plan: 1. Benign prostatic hyperplasia with urinary frequency (Primary) Start tamsulosin as prescribed. Follow up in November unless there are any further issues call the clinic if so.  - tamsulosin (FLOMAX) 0.4 MG CAPS capsule; Take 1 capsule (0.4 mg total) by mouth daily.  Dispense: 30 capsule; Refill: 5   General Counseling: alejandra barna understanding of the findings of todays visit and agrees with plan of treatment. I have discussed any further diagnostic evaluation that may be needed or ordered today. We also reviewed his medications today. he has been encouraged to call the office with any questions or concerns that should arise related to todays visit.    Counseling:    No orders of the defined types were placed in this  encounter.   Meds ordered this encounter  Medications   tamsulosin (FLOMAX) 0.4 MG CAPS capsule    Sig: Take 1 capsule (0.4 mg total) by mouth daily.    Dispense:  30 capsule    Refill:  5    Return if symptoms worsen or fail to improve, for has appt in november, if any issues before then call the office .  [place] Controlled Substance Database was reviewed by me for overdose risk score (ORS)  Time spent:20 Minutes Time spent with patient included reviewing progress notes, labs, imaging studies, and discussing plan for follow up.   This patient was seen by Mardy Maxin, FNP-C in collaboration with Dr. Sigrid Bathe as a part of collaborative care agreement.  Denette Hass R. Maxin, MSN, FNP-C Internal Medicine

## 2024-10-21 ENCOUNTER — Encounter: Payer: Self-pay | Admitting: Nurse Practitioner

## 2024-10-21 ENCOUNTER — Ambulatory Visit: Admitting: Nurse Practitioner

## 2024-10-21 VITALS — BP 120/80 | HR 73 | Temp 96.5°F | Resp 16 | Ht 65.0 in | Wt 233.4 lb

## 2024-10-21 DIAGNOSIS — N401 Enlarged prostate with lower urinary tract symptoms: Secondary | ICD-10-CM

## 2024-10-21 DIAGNOSIS — Z1212 Encounter for screening for malignant neoplasm of rectum: Secondary | ICD-10-CM

## 2024-10-21 DIAGNOSIS — G20A1 Parkinson's disease without dyskinesia, without mention of fluctuations: Secondary | ICD-10-CM | POA: Diagnosis not present

## 2024-10-21 DIAGNOSIS — G20B2 Parkinson's disease with dyskinesia, with fluctuations: Secondary | ICD-10-CM | POA: Insufficient documentation

## 2024-10-21 DIAGNOSIS — J849 Interstitial pulmonary disease, unspecified: Secondary | ICD-10-CM

## 2024-10-21 DIAGNOSIS — G8929 Other chronic pain: Secondary | ICD-10-CM

## 2024-10-21 DIAGNOSIS — Z1211 Encounter for screening for malignant neoplasm of colon: Secondary | ICD-10-CM

## 2024-10-21 DIAGNOSIS — M545 Low back pain, unspecified: Secondary | ICD-10-CM

## 2024-10-21 DIAGNOSIS — I10 Essential (primary) hypertension: Secondary | ICD-10-CM | POA: Diagnosis not present

## 2024-10-21 DIAGNOSIS — R350 Frequency of micturition: Secondary | ICD-10-CM

## 2024-10-21 MED ORDER — MELOXICAM 15 MG PO TABS: 15.0000 mg | ORAL_TABLET | Freq: Every day | ORAL | 5 refills | Status: AC

## 2024-10-21 NOTE — Progress Notes (Signed)
 [facility] [address] [place], [place] [address]  Internal MEDICINE  Office Visit Note  Patient Name: Thomas Hood  [id number]  [id number]  Date of Service: 10/21/2024  Chief Complaint  Patient presents with   Hypertension   Follow-up    HPI Delores presents for a follow-up visit for chronic low back pain, parkinson's disease, hypertension, high cholesterol, and BPH Low back pain, hurts worse when walking but improves when he sits or lays down. Onset was 6-8 months ago but it has gradually gotten worse. Most likely age related arthritis. Not currently on any medication for pain or inflammation.  Parkinson's disease -- he sees Dr. Lane at [facility]. His last visit was last month. His sinemet dose was increased, trazodone dose increased, and he os stoll taking ropinirole and gabapentin.  Hypertension -- he takes metoprolol  and torsemide and his BP is controlled High cholesterol -- currently taking atorvastatin  40 mg daily.  BPH -- followed by urology and he is taking sildenafil  and tamsulosin .  ILD -- sees Dr. Parris, [facility] pulmonology  Morbid obesity -- activity is limited due to parkinson's disease, BMI is >38.   Current Medication: Outpatient Encounter Medications as of 10/21/2024  Medication Sig   meloxicam (MOBIC) 15 MG tablet Take 1 tablet (15 mg total) by mouth daily.   aspirin  EC 81 MG tablet Take 81 mg by mouth daily. Swallow whole. (Patient not taking: Reported on 10/21/2024)   atorvastatin  (LIPITOR) 40 MG tablet Take 1 tablet (40 mg total) by mouth daily. for cholesterol.   cetirizine (ZYRTEC) 10 MG tablet Take 10 mg by mouth at bedtime.   fluticasone (FLONASE) 50 MCG/ACT nasal spray Place into both nostrils daily.   gabapentin (NEURONTIN) 300 MG capsule Take 300 mg by mouth daily as needed (restless leg syndrome).   levothyroxine  (SYNTHROID ) 50 MCG tablet Take 1 tablet (50 mcg total) by mouth daily. In the morning on an empty stomach    metoprolol  tartrate (LOPRESSOR ) 25 MG tablet Take 1 tablet (25 mg total) by mouth 2 (two) times daily. Take with a meal   rOPINIRole (REQUIP) 0.25 MG tablet Take 0.25 mg by mouth 3 (three) times daily.   sildenafil  (VIAGRA ) 100 MG tablet Take 100 mg by mouth daily as needed for erectile dysfunction.   tamsulosin  (FLOMAX ) 0.4 MG CAPS capsule Take 1 capsule (0.4 mg total) by mouth daily.   torsemide (DEMADEX) 20 MG tablet Take 20 mg by mouth every 3 (three) days.   [DISCONTINUED] lisinopril (ZESTRIL) 5 MG tablet Take 5 mg by mouth every evening. (Patient not taking: Reported on 10/21/2024)   No facility-administered encounter medications on file as of 10/21/2024.    Surgical History: Past Surgical History:  Procedure Laterality Date   CATARACT EXTRACTION W/PHACO Left 12/23/2023   Procedure: CATARACT EXTRACTION PHACO AND INTRAOCULAR LENS PLACEMENT (IOC) LEFT;  Surgeon: Myrna Adine Anes, MD;  Location: [facility];  Service: Ophthalmology;  Laterality: Left;  2.82 0:27.7   CATARACT EXTRACTION W/PHACO Right 01/06/2024   Procedure: CATARACT EXTRACTION PHACO AND INTRAOCULAR LENS PLACEMENT (IOC) RIGHT;  Surgeon: Myrna Adine Anes, MD;  Location: [facility];  Service: Ophthalmology;  Laterality: Right;  1.69 0:18.8   CHOLESTEATOMA EXCISION Right    COLONOSCOPY     3-4 yrs. ago  Hx polyps   COLONOSCOPY WITH PROPOFOL  N/A 05/01/2018   Procedure: COLONOSCOPY WITH PROPOFOL ;  Surgeon: Janalyn Keene NOVAK, MD;  Location: [facility];  Service: Endoscopy;  Laterality: N/A;   CORONARY ARTERY BYPASS  GRAFT     ESOPHAGOGASTRODUODENOSCOPY (EGD) WITH PROPOFOL  N/A 05/01/2018   Procedure: ESOPHAGOGASTRODUODENOSCOPY (EGD) WITH PROPOFOL ;  Surgeon: Janalyn Keene NOVAK, MD;  Location: [facility] END[facility];  Service: Endoscopy;  Laterality: N/A;   RIGHT/LEFT HEART CATH AND CORONARY ANGIOGRAPHY Bilateral 10/11/2023   Procedure: RIGHT/LEFT HEART CATH AND CORONARY ANGIOGRAPHY;  Surgeon: Florencio Cara BIRCH,  MD;  Location: [facility];  Service: Cardiovascular;  Laterality: Bilateral;   TYMPANOPLASTY W/ MASTOIDECTOMY  08/23/2015    Medical History: Past Medical History:  Diagnosis Date   Arthritis    Atherosclerosis    CAD (coronary artery disease)    Dizziness    HTN (hypertension)    Mitral valve prolapse    Pulmonary hypertension (HCC)    S/P CABG x 4    Wears dentures    full upper and lower   Wears hearing aid in both ears     Family History: Family History  Problem Relation Age of Onset   Cancer Mother        colon   Cancer Sister        lung cancer-hx smoking   Heart failure Sister     Social History   Socioeconomic History   Marital status: Single    Spouse name: Not on file   Number of children: Not on file   Years of education: Not on file   Highest education level: Not on file  Occupational History   Not on file  Tobacco Use   Smoking status: Former    Current packs/day: 0.00    Average packs/day: 1 pack/day for 20.0 years (20.0 ttl pk-yrs)    Types: Cigarettes    Start date: 17    Quit date: 26    Years since quitting: 35.8   Smokeless tobacco: Never  Vaping Use   Vaping status: Never Used  Substance and Sexual Activity   Alcohol use: Not Currently    Comment: occ.   Drug use: Never   Sexual activity: Not on file  Other Topics Concern   Not on file  Social History Narrative   Not on file   Social Drivers of Health   Financial Resource Strain: Low Risk  (08/01/2023)   Received from [facility]   Overall Financial Resource Strain (CARDIA)    Difficulty of Paying Living Expenses: Not hard at all  Food Insecurity: No Food Insecurity (08/01/2023)   Received from [facility]   Hunger Vital Sign    Within the past 12 months, you worried that your food would run out before you got the money to buy more.: Never true    Within the past 12 months, the food you bought just didn't last and you didn't have  money to get more.: Never true  Transportation Needs: No Transportation Needs (08/01/2023)   Received from [facility] - Transportation    In the past 12 months, has lack of transportation kept you from medical appointments or from getting medications?: No    Lack of Transportation (Non-Medical): No  Physical Activity: Sufficiently Active (11/10/2021)   Exercise Vital Sign    Days of Exercise per Week: 4 days    Minutes of Exercise per Session: 40 min  Stress: No Stress Concern Present (11/10/2021)   [employer] - Occupational Stress Questionnaire    Feeling of Stress : Not at all  Social Connections: Moderately Integrated (11/10/2021)   Social Connection and Isolation Panel  Frequency of Communication with Friends and Family: More than three times a week    Frequency of Social Gatherings with Friends and Family: More than three times a week    Attends Religious Services: Never    Database Administrator or Organizations: No    Attends Engineer, Structural: More than 4 times per year    Marital Status: Married  Catering Manager Violence: Not At Risk (11/10/2021)   Humiliation, Afraid, Rape, and Kick questionnaire    Fear of Current or Ex-Partner: No    Emotionally Abused: No    Physically Abused: No    Sexually Abused: No      Review of Systems  Constitutional:  Negative for chills, fatigue and unexpected weight change.  HENT:  Positive for congestion, postnasal drip, rhinorrhea and sneezing. Negative for sore throat.   Eyes:  Negative for redness.  Respiratory:  Negative for cough, chest tightness and shortness of breath.   Cardiovascular: Negative.  Negative for chest pain and palpitations.  Gastrointestinal: Negative.  Negative for abdominal pain, constipation, diarrhea, nausea and vomiting.  Genitourinary:  Negative for dysuria and frequency.  Musculoskeletal:  Positive for arthralgias (left knee pain) and joint  swelling. Negative for back pain and neck pain.  Skin:  Negative for rash.  Neurological: Negative.  Negative for tremors and numbness.  Hematological:  Negative for adenopathy. Does not bruise/bleed easily.  Psychiatric/Behavioral:  Negative for behavioral problems (Depression), self-injury, sleep disturbance and suicidal ideas. The patient is not nervous/anxious.     Vital Signs: BP 120/80   Pulse 73   Temp (!) 96.5 F (35.8 C)   Resp 16   Ht 5' 5 (1.651 m)   Wt 233 lb 6.4 oz (105.9 kg)   SpO2 97%   BMI 38.84 kg/m    Physical Exam Vitals reviewed.  Constitutional:      General: He is not in acute distress.    Appearance: Normal appearance. He is obese. He is not ill-appearing.  HENT:     Head: Normocephalic and atraumatic.  Eyes:     Pupils: Pupils are equal, round, and reactive to light.  Cardiovascular:     Rate and Rhythm: Normal rate and regular rhythm.  Pulmonary:     Effort: Pulmonary effort is normal. No respiratory distress.  Musculoskeletal:     Left knee: Swelling present. Decreased range of motion. Tenderness present.  Neurological:     Mental Status: He is alert and oriented to person, place, and time.  Psychiatric:        Mood and Affect: Mood normal.        Behavior: Behavior normal.        Assessment/Plan: 1. Chronic midline low back pain without sciatica (Primary) Start meloxicam as prescribed, call clinic if pain is not improving or is worsening.  - meloxicam (MOBIC) 15 MG tablet; Take 1 tablet (15 mg total) by mouth daily.  Dispense: 30 tablet; Refill: 5  2. Essential hypertension Stable, Continue metoprolol  and torsemide as prescribed   3. Parkinson's disease without dyskinesia or fluctuating manifestations (HCC) Continue medications as prescribed by neurology and follow up with neurology as instructed.   4. Benign prostatic hyperplasia with urinary frequency Follow up with urology and continue tamsulosin  and sildenafil .   5. Screening  for colorectal cancer Referred to GI for routine colonoscopy - Ambulatory referral to Gastroenterology  6. Interstitial pulmonary disease (HCC) Continue to follow up with Dr. Aleskerov as instructed   7. Obesity, morbid (HCC) Activity  as tolerated. Continue low carb, low sugar diet.     General Counseling: dracen reigle understanding of the findings of todays visit and agrees with plan of treatment. I have discussed any further diagnostic evaluation that may be needed or ordered today. We also reviewed his medications today. he has been encouraged to call the office with any questions or concerns that should arise related to todays visit.    Orders Placed This Encounter  Procedures   Ambulatory referral to Gastroenterology    Meds ordered this encounter  Medications   meloxicam (MOBIC) 15 MG tablet    Sig: Take 1 tablet (15 mg total) by mouth daily.    Dispense:  30 tablet    Refill:  5    Fill new script today    Return in about 6 months (around 04/20/2025) for AWV, Markevious Ehmke PCP.   Total time spent:30 Minutes Time spent includes review of chart, medications, test results, and follow up plan with the patient.   [place] Controlled Substance Database was reviewed by me.  This patient was seen by Mardy Maxin, FNP-C in collaboration with Dr. Sigrid Bathe as a part of collaborative care agreement.   Andres Bantz R. Maxin, MSN, FNP-C Internal medicine

## 2024-10-22 ENCOUNTER — Ambulatory Visit: Admitting: Nurse Practitioner

## 2024-10-22 ENCOUNTER — Telehealth: Payer: Self-pay | Admitting: Nurse Practitioner

## 2024-10-22 NOTE — Telephone Encounter (Signed)
 Awaiting 10/21/24 office notes for GI referral-Toni

## 2024-10-28 ENCOUNTER — Telehealth: Payer: Self-pay | Admitting: Nurse Practitioner

## 2024-10-28 NOTE — Telephone Encounter (Signed)
 Gastroenterology referral sent via [facility]. Notified patient. Gave patient telephone # [phone]

## 2024-11-27 NOTE — Telephone Encounter (Signed)
 Gastroenterology appointment 01/22/2025 with [facility] -And[place]

## 2025-01-28 ENCOUNTER — Ambulatory Visit: Admit: 2025-01-28 | Admitting: Gastroenterology

## 2025-04-20 ENCOUNTER — Ambulatory Visit: Admitting: Nurse Practitioner
